# Patient Record
Sex: Male | Born: 1960 | Race: Black or African American | Hispanic: No | Marital: Single | Smoking: Current every day smoker
Health system: Southern US, Community
[De-identification: ages and names within clinical notes are randomized; demographics above are authoritative.]

## PROBLEM LIST (undated history)

## (undated) DIAGNOSIS — E119 Type 2 diabetes mellitus without complications: Secondary | ICD-10-CM

---

## 1998-11-25 ENCOUNTER — Emergency Department (HOSPITAL_COMMUNITY): Admission: EM | Admit: 1998-11-25 | Discharge: 1998-11-25 | Payer: Self-pay | Admitting: Emergency Medicine

## 2002-12-07 ENCOUNTER — Emergency Department (HOSPITAL_COMMUNITY): Admission: EM | Admit: 2002-12-07 | Discharge: 2002-12-07 | Payer: Self-pay | Admitting: *Deleted

## 2006-05-02 ENCOUNTER — Emergency Department (HOSPITAL_COMMUNITY): Admission: EM | Admit: 2006-05-02 | Discharge: 2006-05-02 | Payer: Self-pay | Admitting: Family Medicine

## 2006-11-28 ENCOUNTER — Ambulatory Visit: Payer: Self-pay | Admitting: Internal Medicine

## 2006-11-28 LAB — CONVERTED CEMR LAB
AST: 27 units/L (ref 0–37)
Albumin: 4 g/dL (ref 3.5–5.2)
Alkaline Phosphatase: 54 units/L (ref 39–117)
BUN: 9 mg/dL (ref 6–23)
Basophils Relative: 0.3 % (ref 0.0–1.0)
Bilirubin Urine: NEGATIVE
Bilirubin, Direct: 0.1 mg/dL (ref 0.0–0.3)
CO2: 27 meq/L (ref 19–32)
Chloride: 104 meq/L (ref 96–112)
Cholesterol: 212 mg/dL (ref 0–200)
Creatinine, Ser: 1.1 mg/dL (ref 0.4–1.5)
Eosinophils Relative: 0.9 % (ref 0.0–5.0)
HCT: 41.1 % (ref 39.0–52.0)
Hemoglobin: 14.7 g/dL (ref 13.0–17.0)
MCHC: 35.8 g/dL (ref 30.0–36.0)
PSA: 0.34 ng/mL
PSA: 0.34 ng/mL (ref 0.10–4.00)
Sodium: 139 meq/L (ref 135–145)
TSH: 1.33 microintl units/mL (ref 0.35–5.50)
Total Bilirubin: 0.8 mg/dL (ref 0.3–1.2)
Total CHOL/HDL Ratio: 4.6
Total Protein, Urine: NEGATIVE mg/dL
Total Protein: 7 g/dL (ref 6.0–8.3)
Triglycerides: 86 mg/dL (ref 0–149)
Urobilinogen, UA: 0.2 (ref 0.0–1.0)
VLDL: 17 mg/dL (ref 0–40)
pH: 6 (ref 5.0–8.0)

## 2006-12-01 ENCOUNTER — Ambulatory Visit: Payer: Self-pay | Admitting: Internal Medicine

## 2007-01-12 ENCOUNTER — Ambulatory Visit: Payer: Self-pay | Admitting: Internal Medicine

## 2007-01-12 LAB — CONVERTED CEMR LAB
Bilirubin Urine: NEGATIVE
Creatinine,U: 126.7 mg/dL
Crystals: NEGATIVE
Hemoglobin, Urine: NEGATIVE
Microalb, Ur: 0.3 mg/dL (ref 0.0–1.9)
Microalbumin U total vol: 0.3 mg/L
Nitrite: NEGATIVE
Testosterone: 652.77 ng/dL (ref 350.00–890)
Urine Glucose: NEGATIVE mg/dL
Urobilinogen, UA: 0.2 (ref 0.0–1.0)
pH: 6 (ref 5.0–8.0)

## 2007-03-10 ENCOUNTER — Ambulatory Visit: Payer: Self-pay | Admitting: Internal Medicine

## 2007-03-10 LAB — CONVERTED CEMR LAB
ALT: 21 units/L (ref 0–53)
HDL: 50.5 mg/dL (ref >39.0)
Total CHOL/HDL Ratio: 4.3
VLDL: 19 mg/dL (ref 0–40)

## 2007-03-11 ENCOUNTER — Encounter: Payer: Self-pay | Admitting: Internal Medicine

## 2007-03-11 DIAGNOSIS — R51 Headache: Secondary | ICD-10-CM

## 2007-03-11 DIAGNOSIS — R519 Headache, unspecified: Secondary | ICD-10-CM | POA: Insufficient documentation

## 2007-03-11 DIAGNOSIS — Z8659 Personal history of other mental and behavioral disorders: Secondary | ICD-10-CM

## 2007-03-11 DIAGNOSIS — K219 Gastro-esophageal reflux disease without esophagitis: Secondary | ICD-10-CM

## 2007-03-11 DIAGNOSIS — N3289 Other specified disorders of bladder: Secondary | ICD-10-CM

## 2007-03-11 DIAGNOSIS — E119 Type 2 diabetes mellitus without complications: Secondary | ICD-10-CM

## 2007-03-13 ENCOUNTER — Encounter: Payer: Self-pay | Admitting: Internal Medicine

## 2007-03-13 ENCOUNTER — Ambulatory Visit: Payer: Self-pay | Admitting: Internal Medicine

## 2007-03-13 DIAGNOSIS — M549 Dorsalgia, unspecified: Secondary | ICD-10-CM | POA: Insufficient documentation

## 2007-03-13 DIAGNOSIS — E782 Mixed hyperlipidemia: Secondary | ICD-10-CM | POA: Insufficient documentation

## 2007-03-13 DIAGNOSIS — F528 Other sexual dysfunction not due to a substance or known physiological condition: Secondary | ICD-10-CM | POA: Insufficient documentation

## 2007-04-04 ENCOUNTER — Ambulatory Visit: Payer: Self-pay | Admitting: Internal Medicine

## 2007-04-04 DIAGNOSIS — R300 Dysuria: Secondary | ICD-10-CM

## 2007-06-14 ENCOUNTER — Ambulatory Visit: Payer: Self-pay | Admitting: Internal Medicine

## 2007-06-14 DIAGNOSIS — I10 Essential (primary) hypertension: Secondary | ICD-10-CM | POA: Insufficient documentation

## 2007-06-14 LAB — CONVERTED CEMR LAB
Bilirubin, Direct: 0.1 mg/dL (ref 0.0–0.3)
CO2: 29 meq/L (ref 19–32)
Calcium: 9.8 mg/dL (ref 8.4–10.5)
Cholesterol: 225 mg/dL (ref 0–200)
GFR calc Af Amer: 103 mL/min
GFR calc non Af Amer: 85 mL/min
Glucose, Bld: 100 mg/dL — ABNORMAL HIGH (ref 70–99)
HDL: 42.4 mg/dL (ref >39.0)
Hgb A1c MFr Bld: 6.4 % — ABNORMAL HIGH (ref 4.6–6.0)
Potassium: 4.5 meq/L (ref 3.5–5.1)
Sodium: 139 meq/L (ref 135–145)
Total CHOL/HDL Ratio: 5.3
Total Protein: 6.9 g/dL (ref 6.0–8.3)
Triglycerides: 114 mg/dL (ref 0–149)

## 2007-06-16 ENCOUNTER — Ambulatory Visit: Payer: Self-pay | Admitting: Internal Medicine

## 2007-06-16 DIAGNOSIS — D179 Benign lipomatous neoplasm, unspecified: Secondary | ICD-10-CM | POA: Insufficient documentation

## 2008-01-10 ENCOUNTER — Ambulatory Visit: Payer: Self-pay | Admitting: Internal Medicine

## 2008-01-10 LAB — CONVERTED CEMR LAB
ALT: 19 units/L (ref 0–53)
AST: 29 units/L (ref 0–37)
BUN: 10 mg/dL (ref 6–23)
Chloride: 112 meq/L (ref 96–112)
Cholesterol: 162 mg/dL (ref 0–200)
GFR calc Af Amer: 103 mL/min
GFR calc non Af Amer: 85 mL/min
Glucose, Bld: 97 mg/dL (ref 70–99)
Ketones, ur: NEGATIVE mg/dL
Leukocytes, UA: NEGATIVE
Potassium: 4 meq/L (ref 3.5–5.1)
Sodium: 141 meq/L (ref 135–145)
Specific Gravity, Urine: 1.02 (ref 1.000–1.03)
Triglycerides: 98 mg/dL (ref 0–149)
Urine Glucose: NEGATIVE mg/dL
Urobilinogen, UA: 0.2 (ref 0.0–1.0)
pH: 6 (ref 5.0–8.0)

## 2008-01-12 ENCOUNTER — Encounter: Payer: Self-pay | Admitting: Internal Medicine

## 2008-01-15 ENCOUNTER — Ambulatory Visit: Payer: Self-pay | Admitting: Internal Medicine

## 2008-01-15 DIAGNOSIS — M7710 Lateral epicondylitis, unspecified elbow: Secondary | ICD-10-CM | POA: Insufficient documentation

## 2008-01-15 DIAGNOSIS — F17200 Nicotine dependence, unspecified, uncomplicated: Secondary | ICD-10-CM | POA: Insufficient documentation

## 2008-03-01 ENCOUNTER — Telehealth: Payer: Self-pay | Admitting: Internal Medicine

## 2008-03-07 ENCOUNTER — Telehealth: Payer: Self-pay | Admitting: Internal Medicine

## 2008-03-19 ENCOUNTER — Ambulatory Visit (HOSPITAL_COMMUNITY): Admission: RE | Admit: 2008-03-19 | Discharge: 2008-03-19 | Payer: Self-pay | Admitting: Orthopedic Surgery

## 2008-03-27 ENCOUNTER — Ambulatory Visit: Payer: Self-pay | Admitting: Internal Medicine

## 2008-04-24 ENCOUNTER — Ambulatory Visit: Payer: Self-pay | Admitting: Internal Medicine

## 2008-07-03 ENCOUNTER — Encounter (INDEPENDENT_AMBULATORY_CARE_PROVIDER_SITE_OTHER): Payer: Self-pay | Admitting: *Deleted

## 2008-10-11 IMAGING — CR DG LUMBAR SPINE 2-3V
2 series · 2 of 2 positions shown · non-contrast
Comparison: None.

CLINICAL DATA: [DATE]. Low back pain.

LUMBAR SPINE - 2 VIEW 03/13/2007:

[view not recorded (1 of 2)]
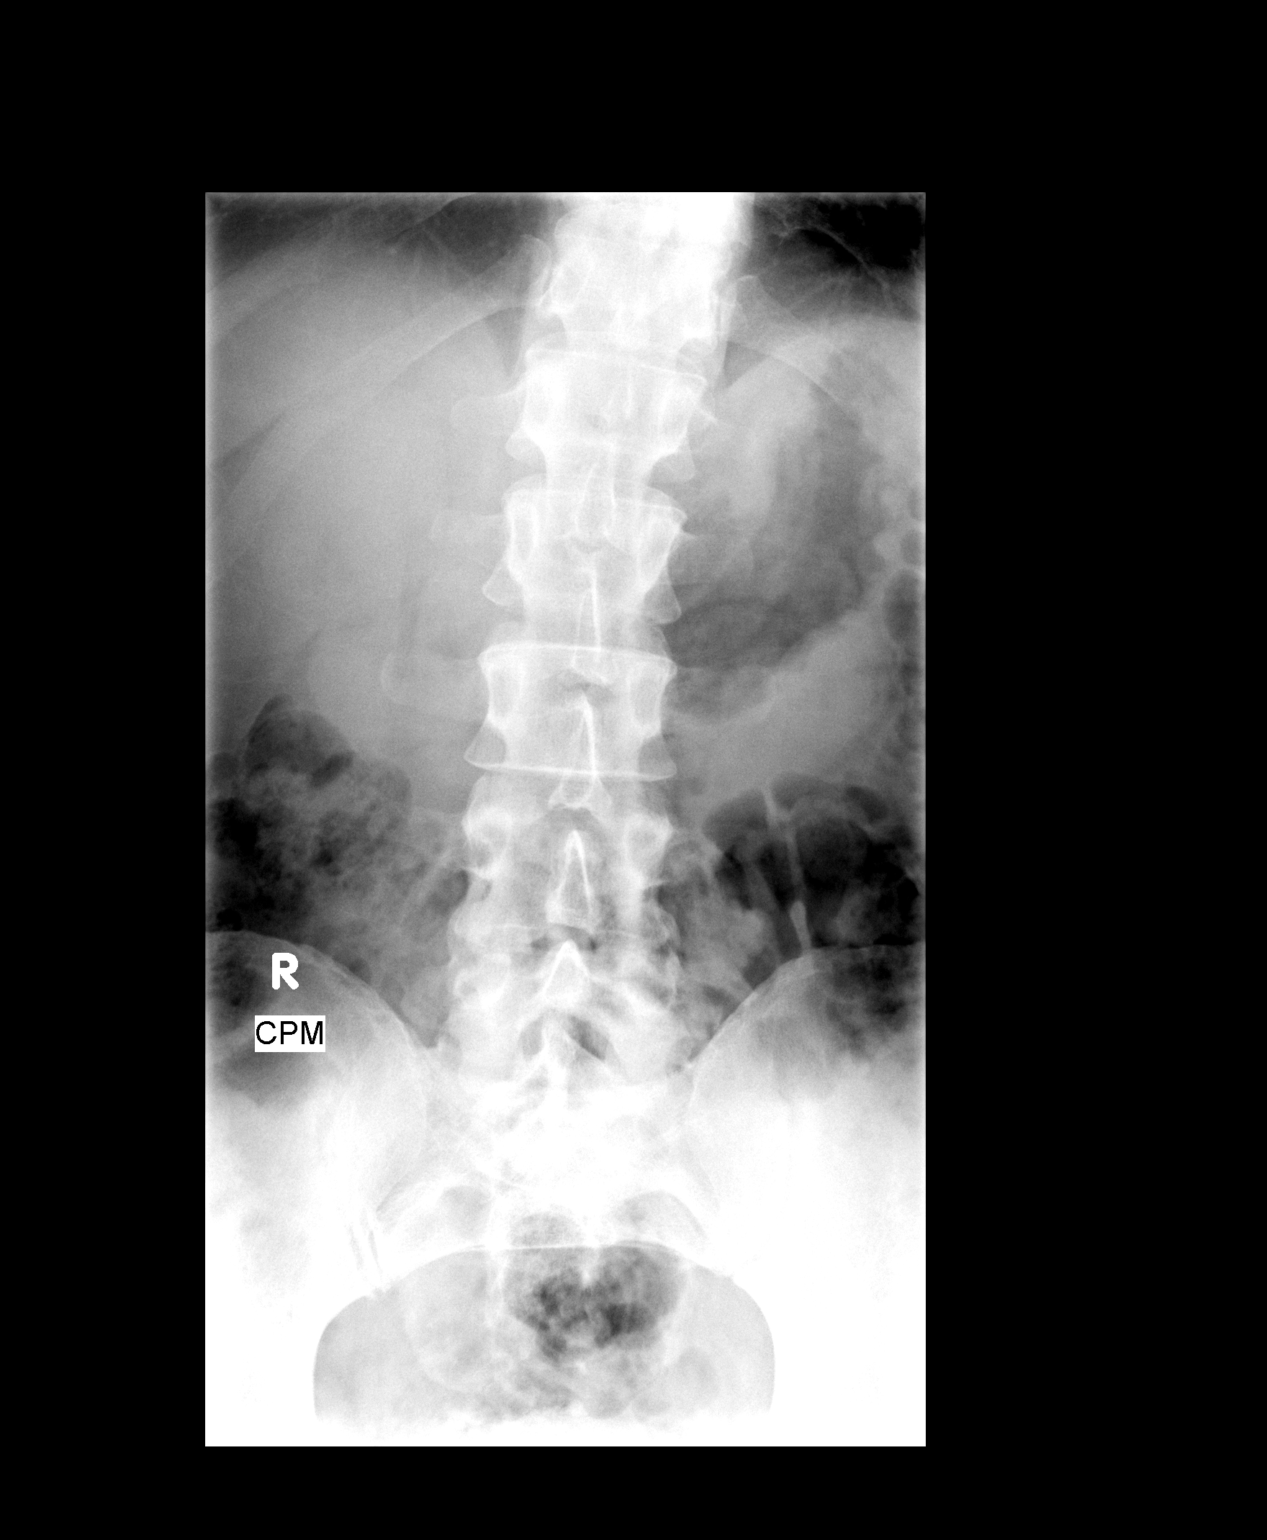

[view not recorded (2 of 2)]
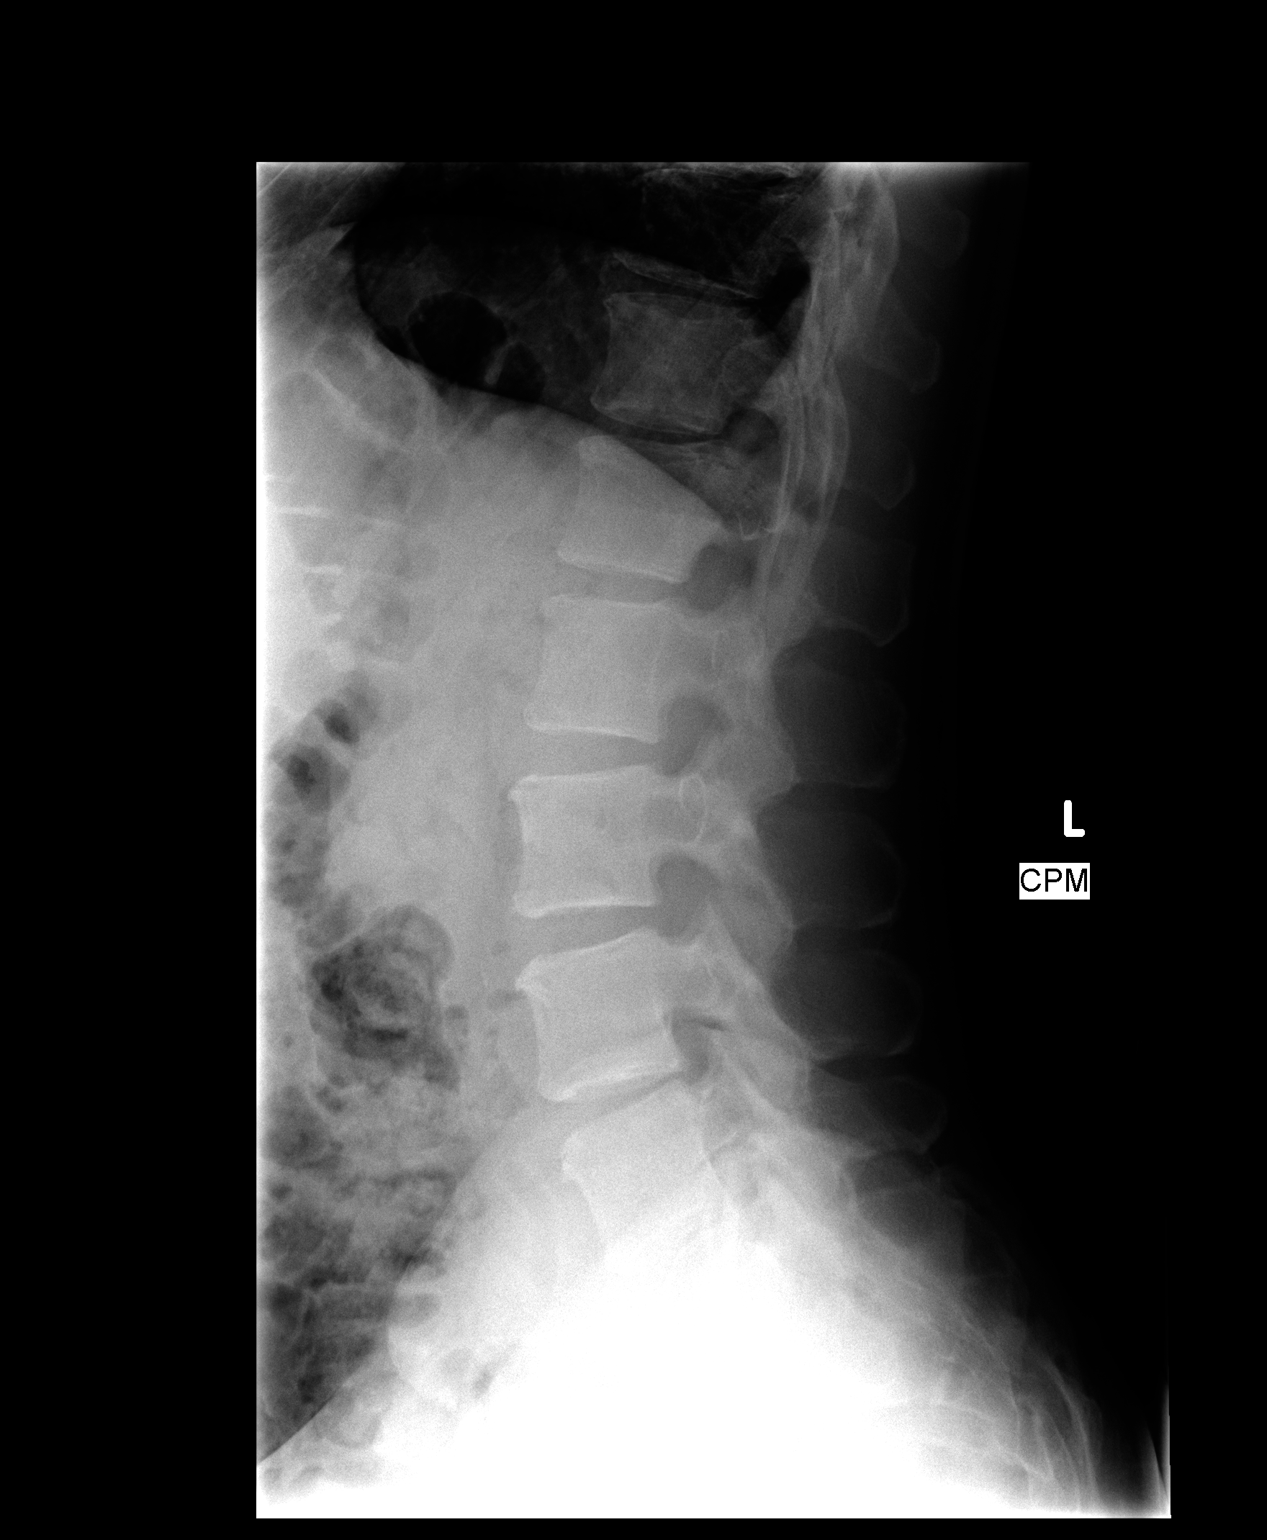

[2 of 2 positions shown; findings below may reference images not displayed]

FINDINGS: Five nonrib-bearing lumbar vertebra with anatomic alignment. No
fractures. Mild disc space narrowing at L4-L5. Mild endplate spurring adjacent
to the L3-L4 and L4-L5 discs. No significant posterior element hypertrophy.
Visualized sacroiliac joints intact.
IMPRESSION: No acute skeletal abnormalities. Mild degenerative disc disease at L4-L5. Mild
spondylosis involving L2, L3 and L4.

## 2009-01-14 ENCOUNTER — Ambulatory Visit: Payer: Self-pay | Admitting: Internal Medicine

## 2009-01-14 LAB — CONVERTED CEMR LAB
ALT: 14 units/L (ref 0–53)
AST: 19 units/L (ref 0–37)
Alkaline Phosphatase: 57 units/L (ref 39–117)
Bilirubin, Direct: <0.1 mg/dL (ref 0.0–0.3)
CO2: 24 meq/L (ref 19–32)
Calcium: 9.8 mg/dL (ref 8.4–10.5)
Cholesterol: 155 mg/dL (ref 0–200)
Creatinine, Ser: 1.06 mg/dL (ref 0.40–1.50)
Glucose, Bld: 111 mg/dL — ABNORMAL HIGH (ref 70–99)
Microalb, Ur: 0.69 mg/dL (ref 0.00–1.89)
Sodium: 138 meq/L (ref 135–145)
Total Bilirubin: 0.2 mg/dL — ABNORMAL LOW (ref 0.3–1.2)
Total CHOL/HDL Ratio: 2.7

## 2009-01-15 ENCOUNTER — Encounter: Payer: Self-pay | Admitting: Internal Medicine

## 2009-01-15 LAB — CONVERTED CEMR LAB
Chlamydia, Swab/Urine, PCR: NEGATIVE
GC Probe Amp, Urine: NEGATIVE

## 2009-01-16 ENCOUNTER — Encounter: Payer: Self-pay | Admitting: Internal Medicine

## 2009-01-16 ENCOUNTER — Telehealth: Payer: Self-pay | Admitting: Internal Medicine

## 2009-04-29 ENCOUNTER — Encounter: Admission: RE | Admit: 2009-04-29 | Discharge: 2009-04-29 | Payer: Self-pay | Admitting: Family Medicine

## 2009-05-29 ENCOUNTER — Telehealth: Payer: Self-pay | Admitting: Internal Medicine

## 2009-11-24 ENCOUNTER — Emergency Department (HOSPITAL_COMMUNITY): Admission: EM | Admit: 2009-11-24 | Discharge: 2009-11-24 | Payer: Self-pay | Admitting: Emergency Medicine

## 2009-12-17 ENCOUNTER — Ambulatory Visit: Payer: Self-pay | Admitting: Family

## 2009-12-17 ENCOUNTER — Ambulatory Visit: Payer: Self-pay | Admitting: Diagnostic Radiology

## 2009-12-17 ENCOUNTER — Ambulatory Visit (HOSPITAL_BASED_OUTPATIENT_CLINIC_OR_DEPARTMENT_OTHER): Admission: RE | Admit: 2009-12-17 | Discharge: 2009-12-17 | Payer: Self-pay | Admitting: Internal Medicine

## 2009-12-17 DIAGNOSIS — R209 Unspecified disturbances of skin sensation: Secondary | ICD-10-CM | POA: Insufficient documentation

## 2009-12-17 LAB — CONVERTED CEMR LAB
Folate: >20 ng/mL
Hgb A1c MFr Bld: 6.2 % — ABNORMAL HIGH (ref <5.7)
Vitamin B-12: 379 pg/mL (ref 211–911)

## 2009-12-18 ENCOUNTER — Encounter: Payer: Self-pay | Admitting: Internal Medicine

## 2009-12-18 ENCOUNTER — Telehealth: Payer: Self-pay | Admitting: Family

## 2009-12-18 LAB — CONVERTED CEMR LAB: Chlamydia, Swab/Urine, PCR: NEGATIVE

## 2009-12-19 ENCOUNTER — Telehealth: Payer: Self-pay | Admitting: Family

## 2009-12-22 ENCOUNTER — Telehealth: Payer: Self-pay | Admitting: Family

## 2010-01-05 ENCOUNTER — Ambulatory Visit: Payer: Self-pay | Admitting: Internal Medicine

## 2010-01-05 LAB — CONVERTED CEMR LAB
Bilirubin Urine: NEGATIVE
Hemoglobin, Urine: NEGATIVE
Ketones, ur: NEGATIVE mg/dL
Protein, ur: NEGATIVE mg/dL
Urine Glucose: NEGATIVE mg/dL
Urobilinogen, UA: 0.2 (ref 0.0–1.0)

## 2010-01-06 ENCOUNTER — Telehealth: Payer: Self-pay | Admitting: Internal Medicine

## 2010-01-08 ENCOUNTER — Encounter (INDEPENDENT_AMBULATORY_CARE_PROVIDER_SITE_OTHER): Payer: Self-pay | Admitting: *Deleted

## 2010-01-21 ENCOUNTER — Encounter: Payer: Self-pay | Admitting: Internal Medicine

## 2010-06-07 ENCOUNTER — Encounter: Payer: Self-pay | Admitting: Internal Medicine

## 2010-06-16 NOTE — Letter (Signed)
Summary: Primary Care Consult Scheduled Letter  [place] [facility]  [address]   [place], [place] [address]   Phone: [phone]  Fax: [phone]      01/08/2010 MRN: [id number]  [facility] [address] [place], [place]  [address]    Dear Curtis Herrera,      We have scheduled an appointment for you.  At the recommendation of Dr.YOO, we have scheduled you a consult with DR Antoine Poche UROLOGY  on SEPTEMBER  7,2011 at 3PM .  Their address is_[facility],  [address]  [place]  [id number]. The office phone number is [phone].  If this appointment day and time is not convenient for you, please feel free to call the office of the doctor you are being referred to at the number listed above and reschedule the appointment.     It is important for you to keep your scheduled appointments. We are here to make sure you are given good patient care.    Thank you,   Darral Dash Patient Care Coordinator [place] at [facility]

## 2010-06-16 NOTE — Assessment & Plan Note (Signed)
Summary: FOLLOW UP/DK   Vital Signs:  Patient profile:   50 year old male Weight:      159.50 pounds BMI:     33.46 O2 Sat:      99 % on Room air Temp:     98.0 degrees F oral Pulse rate:   93 / minute Pulse rhythm:   regular Resp:     18 per minute BP sitting:   120 / 80  (right arm) Cuff size:   regular  Vitals Entered By: Glendell Docker CMA (January 05, 2010 8:20 AM)  O2 Flow:  Room air CC: follow-up visit Is Patient Diabetic? Yes Pain Assessment Patient in pain? no      Comments does not feel  any better    Primary Care Provider:  Dondra Spry DO  CC:  follow-up visit.  History of Present Illness: 50 y/o AA male for f/u re:  genital symptoms itching around genitals - penis and base of penis  tip of penis is irritated with urination urethral symptoms better better after finishing abx but not resolved he was tx'ed with azithomycin, cipro and metronidiazole  intermittent suprapubic discomfort  wife went to GYN and was diagnosed with chlamydia  itching and burning better but not completely resolved  he denies ulcer or other skin lesion itching is not severe, not worse at night   Preventive Screening-Counseling & Management  Alcohol-Tobacco     Smoking Status: current  Allergies (verified): No Known Drug Allergies  Past History:  Past Medical History: Diabetes mellitus, type II GERD  Headache  Low back pain  Erectile dysfunction    Right epicondylitis   Family History: Family History Diabetes 1st degree relative Family History Other cancer     Social History: Single Current Smoker 1/2 ppd >25 yrs. Alcohol use-yes 3 Daughters  Occupation: [employer]     Physical Exam  General:  alert, well-developed, and well-nourished.   Lungs:  Normal respiratory effort, chest expands symmetrically. Lungs are clear to auscultation, no crackles or wheezes. Heart:  Normal rate and regular rhythm. S1 and S2 normal without gallop, murmur, click,  rub or other extra sounds. Abdomen:  soft, non-tender, normal bowel sounds, and no masses.   Genitalia:  uncircumcised, no cutaneous lesions, and no urethral discharge.     Impression & Recommendations:  Problem # 1:  DYSURIA (ICD-788.1) 50 y/o AA male with symptoms of unresolved urethritis despite taking azithromycin, cipro and flagyl. we discussed possibility of HSV.  empiric tx with valtrex if persistent symptoms, pt would like urology referral  The following medications were removed from the medication list:    Azithromycin 500 Mg Tabs (Azithromycin) .Marland Kitchen... 2 tabs by mouth x 1 dose    Metronidazole 500 Mg Tabs (Metronidazole) ..... One tablet by mouth three times a day x 7 days  Orders: T-Urinalysis ([id number]) T-Culture, Urine ([id number]) T- * Misc. Laboratory test ([phone])  Complete Medication List: 1)  Metformin Hcl 750 Mg Xr24h-tab (Metformin hcl) .... One by mouth two times a day 2)  Simvastatin 20 Mg Tabs (Simvastatin) .... One by mouth at bedtime 3)  Aspirin Ec Low Dose 81 Mg Tbec (Aspirin) .... One by mouth once daily 4)  Viagra 100 Mg Tabs (Sildenafil citrate) .... One half to one tab by mouth once daily as needed 5)  Freestyle Lite Test Strp (Glucose blood) .... Test daily  Patient Instructions: 1)  Please schedule a follow-up appointment in 2 weeks. 2)  The highlighted prescriptions were electronically sent  to your pharmacy Prescriptions: VALACYCLOVIR HCL 1 GM TABS (VALACYCLOVIR HCL) one by mouth two times a day  #14 x 0   Entered and Authorized by:   D. Thomos Lemons DO   Signed by:   D. Thomos Lemons DO on 01/05/2010   Method used:   Electronically to        [facility] [phone]* (retail)       [address]       [place], [place]  [address]       Ph: [phone]       Fax: [phone]   RxID:   [id number]   Current Allergies (reviewed today): No known allergies

## 2010-06-16 NOTE — Progress Notes (Signed)
Summary: Lab Results   Phone Note Outgoing Call   Summary of Call: Please call patient and let him know that his lab work was normal.  I would like to send him for an MRI of his neck to further evaluate the discs in his neck.  Initial call taken by: Lemont Fillers FNP,  December 18, 2009 9:17 PM  Follow-up for Phone Call        patient advised per [facility] instructions Follow-up by: Glendell Docker CMA,  December 19, 2009 8:03 AM

## 2010-06-16 NOTE — Progress Notes (Signed)
Summary: Do you want labs  Phone Note Call from Patient   Summary of Call: pt. is coming on 06/06/09 for his 4 mo. f/u. Do you need him to get any labs done prior to this? Call cell [phone] Initial call taken by: Michaelle Copas,  May 29, 2009 1:33 PM  Follow-up for Phone Call        bmet, a1c - 250.00 Follow-up by: D. Thomos Lemons DO,  May 29, 2009 5:26 PM  Additional Follow-up for Phone Call Additional follow up Details #1::        informed pt. of labs Additional Follow-up by: Michaelle Copas,  May 30, 2009 9:08 AM

## 2010-06-16 NOTE — Assessment & Plan Note (Signed)
Summary: diabetes/mhf--Rm 5   Vital Signs:  Patient profile:   50 year old male Height:      58 inches Weight:      161.25 pounds BMI:     33.82 Temp:     98.0 degrees F oral Pulse rate:   72 / minute Pulse rhythm:   regular Resp:     16 per minute BP sitting:   124 / 70  (right arm) Cuff size:   regular  Vitals Entered By: Mervin Kung CMA Duncan Dull) (December 17, 2009 10:51 AM) CC: Room 5   Follow up of diabetes. Also having bilateral numbness in hands and arms.  Having penile discomfort and itching; wants STD testing. Needs refills on Metformin, Viagra and Simvastatin. Is Patient Diabetic? Yes Comments Pt has completed Doxycycline. Nicki Guadalajara Fergerson CMA Duncan Dull)  December 17, 2009 10:59 AM    Primary Care Provider:  Dondra Spry DO  CC:  Room 5   Follow up of diabetes. Also having bilateral numbness in hands and arms.  Having penile discomfort and itching; wants STD testing. Needs refills on Metformin and Viagra and Simvastatin.Marland Kitchen  History of Present Illness: Curtis Herrera is a 50 year old male who presents today with several concerns:  1)Bilateral arm numbness- worse at night.  Numbness starts below the elbow to the hands and is bilateral in nature.  Comes and goes, but has become more frequent.  Denies neck pain.  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 This has been present x 3 weeks.    2) Notes that he also has complaint about penile disomfort "inside".  + external itching.  These symptoms started monday.  Denies fever.  Denies penile lesions or penile discharge.  Has had one sexual partner has one male partner x 15 years.    Allergies (verified): No Known Drug Allergies  Past History:  Past Medical History: Last updated: 01/14/2009 Diabetes mellitus, type II GERD  Headache Low back pain Erectile dysfunction    Right epicondylitis  Family History: Last updated: 04/04/2007 Family History Diabetes 1st degree  relative Family History Other cancer  Social History: Last updated: 04/24/2008 Single Current Smoker 1/2 ppd >25 yrs. Alcohol use-yes 3 Daughters Occupation: [employer]    Risk Factors: Smoking Status: current (01/14/2009) Packs/Day: 0.5 (01/14/2009)  Physical Exam  General:  Well-developed,well-nourished,in no acute distress; alert,appropriate and cooperative throughout examination Head:  Normocephalic and atraumatic without obvious abnormalities. No apparent alopecia or balding. Lungs:  Normal respiratory effort, chest expands symmetrically. Lungs are clear to auscultation, no crackles or wheezes. Heart:  Normal rate and regular rhythm. S1 and S2 normal without gallop, murmur, click, rub or other extra sounds. Abdomen:  Bowel sounds positive,abdomen soft and non-tender without masses, organomegaly or hernias noted. Genitalia:  circumcised and no urethral discharge.  No penile rashes or lesions noted. Neurologic:  Strong bilateral hand grasps.   5/5 bilateral UE strength. UE reflexes normal bilaterally. Negative phalans test, negative tinnel sign.   Psych:  Cognition and judgment appear intact. Alert and cooperative with normal attention span and concentration. No apparent delusions, illusions, hallucinations   Impression & Recommendations:  Problem # 1:  NUMBNESS, ARM (ICD-782.0) Assessment New  Bilateral arm/hand numbness.  C-spine film notes mild degenerative disc disease at C5-6 and to a lesser degree C4-5.  B12/folate WNL.  Will order MRI of the C-spine to further evaluate.      Orders:  TLB-B12 + Folate Pnl ([id number]) T-Cervicle Spine 2-3 Views (72040TC) Misc. Referral (Misc. Ref)  Problem # 2:  DYSURIA (ICD-788.1)  Will plan to send patient for urine culture, gc/Chlamydia DNA probe.  Will treat empirically for chlamydia with zithromax  Orders: Specimen Handling (14782) T-Chlamydia & GC Probe, Urine ([id number]) T-Culture, Urine  ([id number])  Problem # 3:  DIABETES MELLITUS, TYPE II (ICD-250.00) Assessment: Improved A1C wasw slightly improved from his last check (6.4 down to 6.2).  Stable, continue metformin.  Avoid ACE given his history of hyperkalemia.  Continue aspirin.  His updated medication list for this problem includes:    Metformin Hcl 750 Mg Xr24h-tab (Metformin hcl) ..... One by mouth two times a day    Aspirin Ec Low Dose 81 Mg Tbec (Aspirin) ..... One by mouth once daily  Orders: T-Hgb A1C ([id number])  Labs Reviewed: Creat: 1.06 (01/14/2009)   Microalbumin: 0.3 (01/12/2007) Reviewed HgBA1c results: 6.4 (01/14/2009)  6.3 (01/10/2008)  Complete Medication List: 1)  Metformin Hcl 750 Mg Xr24h-tab (Metformin hcl) .... One by mouth two times a day 2)  Simvastatin 20 Mg Tabs (Simvastatin) .... One by mouth at bedtime 3)  Aspirin Ec Low Dose 81 Mg Tbec (Aspirin) .... One by mouth once daily 4)  Viagra 100 Mg Tabs (Sildenafil citrate) .... One half to one tab by mouth once daily as needed 5)  Freestyle Lite Test Strp (Glucose blood) .... Test daily 6)  Azithromycin 500 Mg Tabs (Azithromycin) .... 2 tabs by mouth x 1 dose  Patient Instructions: 1)  Complete your lab work and your x-ray downstairs. 2)  Follow up with Dr. Artist Pais in 1 month. 3)  We will contact you with the results of you tests. Prescriptions: SIMVASTATIN 20 MG  TABS (SIMVASTATIN) one by mouth at bedtime  #30 x 1   Entered and Authorized by:   Lemont Fillers FNP   Signed by:   Lemont Fillers FNP on 12/17/2009   Method used:   Electronically to        [facility] [phone]* (retail)       [address]       [place], [place]  [address]       Ph: [phone]       Fax: [phone]   RxID:   [id number] VIAGRA 100 MG TABS (SILDENAFIL CITRATE) one half to one tab by mouth once daily as needed  #10 Each x 2   Entered and Authorized by:   Lemont Fillers FNP   Signed by:   Lemont Fillers FNP on  12/17/2009   Method used:   Electronically to        [facility] [phone]* (retail)       [address]       [place], [place]  [address]       Ph: [phone]       Fax: [phone]   RxID:   [id number] METFORMIN HCL 750 MG XR24H-TAB (METFORMIN HCL) one by mouth two times a day  #60 Each x 1   Entered and Authorized by:   Lemont Fillers FNP   Signed by:   Lemont Fillers FNP on 12/17/2009   Method used:   Electronically to        [facility] [phone]* (retail)       [address]       [place], [place]  [address]       Ph: [phone]  Fax: [phone]   RxID:   [id number] AZITHROMYCIN 500 MG TABS (AZITHROMYCIN) 2 tabs by mouth x 1 dose  #2 x 0   Entered and Authorized by:   Lemont Fillers FNP   Signed by:   Lemont Fillers FNP on 12/17/2009   Method used:   Electronically to        [facility] [phone]* (retail)       [address]       [place], [place]  [address]       Ph: [phone]       Fax: [phone]   RxID:   [id number]   Current Allergies (reviewed today): No known allergies

## 2010-06-16 NOTE — Progress Notes (Signed)
Summary: Urology Referral  Phone Note Outgoing Call   Summary of Call: call pt - genitial herpes titer is negative.  If pt still having genital symptoms within 1 week, we can help arrange urology evaluation Initial call taken by: D. Thomos Lemons DO,  January 06, 2010 5:55 PM  Follow-up for Phone Call        call placed to patient at [phone], he was advised per Dr Artist Pais instructions. Patient states that he still has unresolved symptoms and he would like to go ahead and have the urology referral arranged Glendell Docker CMA,  January 07, 2010 1:06 PM  patient called back and left voice message wanting to know if he should take the rx for Valtrex. He states he did not pick up the rx because he wanted to find out what the results of the test was.  Glendell Docker CMA  January 07, 2010 1:58 PM   Additional Follow-up for Phone Call Additional follow up Details #1::        no,  hold off on valtrex. see order for urology referral Additional Follow-up by: D. Thomos Lemons DO,  January 08, 2010 8:31 AM    Additional Follow-up for Phone Call Additional follow up Details #2::    call returned to patient at [phone],  no answer. A detailed voice message has been left informing patient per Dr Artist Pais instructions. He has been advised to call if any questions. Follow-up by: Glendell Docker CMA,  January 08, 2010 9:49 AM

## 2010-06-16 NOTE — Progress Notes (Signed)
  Phone Note Outgoing Call   Call placed by: Lemont Fillers FNP,  December 22, 2009 9:09 AM Call placed to: Patient Summary of Call: Left message for patient to return our call.  Will see how he is feeling.  Please let him know that his urine culture is negative.  If he is still having symptoms- will plan to treat with metronidazole empircally for trichomonas.   Initial call taken by: Lemont Fillers FNP,  December 22, 2009 9:10 AM  Follow-up for Phone Call        Called patient and let him know that urine culture was negative.  Pt still having dysuria.  Plan empiric treatment with metronidazole, and if no improvement will plan referral to urology.  Pt instructed to call us if symptoms worsen or do not improve. Follow-up by: Lemont Fillers FNP,  December 22, 2009 9:33 AM    New/Updated Medications: METRONIDAZOLE 500 MG TABS (METRONIDAZOLE) one tablet by mouth three times a day x 7 days Prescriptions: METRONIDAZOLE 500 MG TABS (METRONIDAZOLE) one tablet by mouth three times a day x 7 days  #21 x 0   Entered and Authorized by:   Lemont Fillers FNP   Signed by:   Lemont Fillers FNP on 12/22/2009   Method used:   Electronically to        [facility] [phone]* (retail)       [address]       [place], [place]  [address]       Ph: [phone]       Fax: [phone]   RxID:   [id number]

## 2010-06-16 NOTE — Consult Note (Signed)
Summary: [facility]   Imported By: Sherian Rein 01/31/2010 11:43:59  _____________________________________________________________________  External Attachment:    Type:   Image     Comment:   External Document

## 2010-06-16 NOTE — Progress Notes (Signed)
Summary: Concerns  Phone Note Call from Patient Call back at [phone]--wk,  [phone]  cell   Caller: Patient Call For: Curtis Craze, NP Reason for Call: Privacy/Consent Authorization Summary of Call: Pt states he is still having itching and penile discomfort. Wants to know what can be done to help these symptoms?  Pt concerned that he may have some other STD that we have not tested for. I advised pt that we are still waiting on urine culture results but pt was very anxious and insistent that we do further STD testing.  Please advise.  Nicki Guadalajara Fergerson CMA Duncan Dull)  December 19, 2009 3:38 PM   Follow-up for Phone Call        Called patient back, advised him that I have sent rx to his pharmacy for empiric treatment of UTI.  He reports that he has some external discomfort as well.  Recommended miconazole cream two times a day to penis in case he has an early balanitis.  He is concerned about possibility of trichomonas- told patient that if his urine culture is negative and symptoms persist, that we can consider a trichomonas PCR (urine) on monday. He will call the office on monday and let us know how he is feeling. Follow-up by: Lemont Fillers FNP,  December 19, 2009 4:12 PM    New/Updated Medications: CIPRO 500 MG TABS (CIPROFLOXACIN HCL) one tablet by mouth two times a day x 3 days MICONAZOLE NITRATE 2 % CREA (MICONAZOLE NITRATE) apply twice daily to affected area Prescriptions: CIPRO 500 MG TABS (CIPROFLOXACIN HCL) one tablet by mouth two times a day x 3 days  #6 x 0   Entered and Authorized by:   Lemont Fillers FNP   Signed by:   Lemont Fillers FNP on 12/19/2009   Method used:   Electronically to        [facility] [phone]* (retail)       [address]       [place], [place]  [address]       Ph: [phone]       Fax: [phone]   RxID:   [id number]

## 2010-08-02 LAB — RPR: RPR Ser Ql: NONREACTIVE

## 2010-08-02 LAB — HIV ANTIBODY (ROUTINE TESTING W REFLEX): HIV: NONREACTIVE

## 2010-09-29 NOTE — Assessment & Plan Note (Signed)
[facility]                           PRIMARY CARE OFFICE NOTE   NAME:Sotero, Curtis Herrera                      MRN:          [id number]  DATE:12/01/2006                            DOB:          23-Oct-1960    CHIEF COMPLAINT:  New patient to practice/left shoulder pain.   HISTORY OF PRESENT ILLNESS:  The patient is a 50 year old African-  American male who complains of left shoulder pain for the last 3 months.  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  He reports pain with abduction of his left upper extremity  and pain with laying on his left side.  He has taken over the counter  analgesics with some relief.  He has not seen an orthopedic physician in  the past.   His other concern today is urethritis.  He notes evaluation at [facility] in December 2007;  they performed testing for gonorrhea  and chlamydia.  This was reported negative.  He was treated with  antibiotics at that time.  He still continues to intermittently  experience burning sensation with urination, also reports discomfort  with ejaculation.   He denies any major illnesses, hospitalizations, including heart disease  or diabetes.   He previously lived in [place], [place], and suffered from  severe headaches, which apparently were associated with sinusitis.   PAST MEDICAL HISTORY SUMMARY:  1. History of headaches.  2. History of carpal tunnel syndrome, left wrist.  3. History of tobacco abuse.   CURRENT MEDICATIONS:  1. Multivitamin once a day.  2. Advil p.r.n.   ALLERGIES TO MEDICATIONS:  None known.   SOCIAL HISTORY:  Patient is single, he is currently living with his  common law wife.  He has 3 daughters from previous relation.   HABITS:  He is a social drinker, averages approximately 6 beers per  week. He started smoking at age 72 and currently smokes approximately  1/2 pack per day.   FAMILY HISTORY:  Mother  is 32, apparently is healthy.  Father age 32,  has some sort of cancer and is diabetic.  Patient has multiple siblings  with type 2 diabetes and also daughter is known to be diabetic and has  fibromyalgia.   REVIEW OF SYSTEMS:  No HEENT symptoms.  He denies any chest heaviness  with exertion or shortness of breath.  Has occasional cough. Denies  heartburn.  All other systems as noted above.   PHYSICAL EXAMINATION:  VITALS:  Height is 5 foot 8 inches, weight 171  pounds, temperature is 97.8, pulse is 80, blood pressure is 120/73.  GENERAL:  The patient is a pleasant, well-developed, well-nourished 13-  year-old African-American male in no apparent distress.  HEENT:  Normocephalic.  Atraumatic.  Pupils are equal and reactive to  light bilaterally.  Extraocular motility was intact.  Patient was  anicteric.  Conjunctivae within normal limits.  External auditory canals  and tympanic membranes are clear bilaterally. Oropharyngeal exam was  unremarkable.  NECK:  Supple without any evidence of adenopathy or carotid bruit or  thyromegaly.  CHEST:  Normal inspiratory effort.  Chest was clear to auscultation  bilaterally. No rhonchi, rales or wheezing.  CARDIOVASCULAR:  Regular rate and rhythm.  No significant murmurs, rubs  or gallops appreciated.  ABDOMEN:  Soft, nontender.  Positive bowel sounds.  No organomegaly.  MUSCULOSKELETAL:  Patient was able to abduct his left shoulder with some  pain.  There was point tenderness at AP joint.  NEUROLOGIC:  Cranial nerves II through XII were grossly intact.  He was  nonfocal.  His mood and affect were appropriate.   LABORATORY DATA:  The patient had screening labs with following results:  CBC showed WBC of 11.8, H&H at 14.7 and 41.1.  Platelet count of 241.  Basic metabolic profile notable for a normal creatinine at 1.1, BUN 9,  fasting blood sugar was elevated to 139.  Cholesterol panel:  Total  cholesterol 212, triglycerides 86, HDL 45.7 and LDL of  148.3.  TSH was  normal at 1.33.  A screening PSA was normal at 0.34 and his UA was  unremarkable.   IMPRESSION/RECOMMENDATIONS:  1. Left shoulder pain secondary to probable rotator cuff tendinitis      versus bursitis.  2. Nongonococcal urethritis, question prostatitis.  3. Abnormal glucose.  4. Ongoing tobacco abuse.  5. Health maintenance.   RECOMMENDATIONS:  1. The patient will be referred to [facility] for further      work up and evaluation of left shoulder pain.  I will defer MRI or      injection therapy to the orthopedic physician.  2. The patient will be started on doxycycline 100 mg p.o. b.i.d.  His      UA seems to be unremarkable.  He may need 3 to 4 weeks of Cipro for      possible prostatitis.  We discussed possible referral to urologist.  3. I recommended obtain hemoglobin A1C and we will arrange followup in      2 to 3 weeks.  Patient likely is diabetic considering family      history.    Barbette Hair. Artist Pais, DO  Electronically Signed   RDY/MedQ  DD: 12/02/2006  DT: 12/02/2006  Job #: [id number]

## 2016-12-10 ENCOUNTER — Encounter (HOSPITAL_COMMUNITY): Payer: Self-pay | Admitting: Emergency Medicine

## 2016-12-10 ENCOUNTER — Emergency Department (HOSPITAL_COMMUNITY): Payer: Self-pay

## 2016-12-10 ENCOUNTER — Emergency Department (HOSPITAL_COMMUNITY)
Admission: EM | Admit: 2016-12-10 | Discharge: 2016-12-10 | Disposition: A | Discharge status: Home / Self Care | Payer: Self-pay | Attending: Emergency Medicine | Admitting: Emergency Medicine

## 2016-12-10 DIAGNOSIS — W109XXA Fall (on) (from) unspecified stairs and steps, initial encounter: Secondary | ICD-10-CM | POA: Insufficient documentation

## 2016-12-10 DIAGNOSIS — Y929 Unspecified place or not applicable: Secondary | ICD-10-CM | POA: Insufficient documentation

## 2016-12-10 DIAGNOSIS — Y999 Unspecified external cause status: Secondary | ICD-10-CM | POA: Insufficient documentation

## 2016-12-10 DIAGNOSIS — E119 Type 2 diabetes mellitus without complications: Secondary | ICD-10-CM | POA: Insufficient documentation

## 2016-12-10 DIAGNOSIS — W19XXXA Unspecified fall, initial encounter: Secondary | ICD-10-CM

## 2016-12-10 DIAGNOSIS — F17200 Nicotine dependence, unspecified, uncomplicated: Secondary | ICD-10-CM | POA: Insufficient documentation

## 2016-12-10 DIAGNOSIS — Y9301 Activity, walking, marching and hiking: Secondary | ICD-10-CM | POA: Insufficient documentation

## 2016-12-10 DIAGNOSIS — Z7982 Long term (current) use of aspirin: Secondary | ICD-10-CM | POA: Insufficient documentation

## 2016-12-10 DIAGNOSIS — M25552 Pain in left hip: Secondary | ICD-10-CM | POA: Insufficient documentation

## 2016-12-10 DIAGNOSIS — I10 Essential (primary) hypertension: Secondary | ICD-10-CM | POA: Insufficient documentation

## 2016-12-10 HISTORY — DX: Type 2 diabetes mellitus without complications: E11.9

## 2016-12-10 MED ORDER — OXYCODONE-ACETAMINOPHEN 5-325 MG PO TABS: 2.0000 | ORAL_TABLET | Freq: Four times a day (QID) | ORAL | 0 refills | Status: DC | PRN

## 2016-12-10 MED ORDER — HYDROMORPHONE HCL 1 MG/ML IJ SOLN
0.5000 mg | Freq: Once | INTRAMUSCULAR | Status: AC
  Administered 2016-12-10: 0.5 mg via INTRAMUSCULAR
  Filled 2016-12-10: qty 1

## 2016-12-10 MED ORDER — CYCLOBENZAPRINE HCL 10 MG PO TABS: 10.0000 mg | ORAL_TABLET | Freq: Two times a day (BID) | ORAL | 0 refills | Status: DC | PRN

## 2016-12-10 MED ORDER — LIDO-CAPSAICIN-MEN-METHYL SAL 4-0.0375-5-20 % EX PTCH: 1.0000 | MEDICATED_PATCH | CUTANEOUS | 0 refills | Status: DC

## 2016-12-10 MED FILL — CYCLOBENZAPRINE 10 MG TABLE: 10 | 10 days supply | Qty: 20 | Fill #0

## 2016-12-10 MED FILL — OXYCODONE-ACETAMINOPHEN 5-3: 5-325 | 3 days supply | Qty: 14 | Fill #0

## 2016-12-10 NOTE — ED Provider Notes (Signed)
[facility] DEPT Provider Note   CSN: [id number] Arrival date & time: 12/10/16  1352  By signing my name below, I, [name], attest that this documentation has been prepared under the direction and in the presence of [name], PA-C. Electronically Signed: [name], ED Scribe. 12/10/16. 3:52 PM.  History   Chief Complaint Chief Complaint  Patient presents with  . Fall  . Hip Pain   The history is provided by the patient. No language interpreter was used.   HPI Comments: Curtis Herrera is a 56 y.o. male who presents to the Emergency Department complaining of gradually worsening, severe, hip pain and left lower back pain s/p a mechanical fall that occurred yesterday evening at 8:00pm. The pt states that he was carrying some things, missed a step, and fell backward down 3 steps before landing on his lower left back and hip. The pt denies head injury and syncope. Ambulation and movement exacerbates his pain. The pt works in the movement of equipment. The pt has a PMHx of DM, lipoma, and HLD. He denies back surgeries. The pt denies activity change, SOB, chest pain, abdominal pain, nausea, vomiting, numbness and weakness.   Past Medical History:  Diagnosis Date  . Diabetes mellitus without complication [facility])     Patient Active Problem List   Diagnosis Date Noted  . NUMBNESS, ARM 12/17/2009  . TOBACCO ABUSE 01/15/2008  . [place], RIGHT 01/15/2008  . LIPOMA 06/16/2007  . HYPERTENSION 06/14/2007  . DYSURIA 04/04/2007  . HYPERLIPIDEMIA 03/13/2007  . ERECTILE DYSFUNCTION 03/13/2007  . BACK PAIN 03/13/2007  . DIABETES MELLITUS, TYPE II 03/11/2007  . GERD 03/11/2007  . Other specified disorders of bladder 03/11/2007  . Headache(784.0) 03/11/2007  . DEPRESSION, HX OF 03/11/2007    History reviewed. No pertinent surgical history.     Home Medications    Prior to Admission medications   Medication Sig Start Date End Date Taking? Authorizing Provider    aspirin EC 81 MG tablet Take 81 mg by mouth daily.   Yes [provider]  ibuprofen (ADVIL,MOTRIN) 200 MG tablet Take 400-600 mg by mouth every 6 (six) hours as needed for headache or moderate pain.   Yes [provider]  Multiple Vitamin (MULTIVITAMIN WITH MINERALS) TABS tablet Take 1 tablet by mouth daily.   Yes [provider]  cyclobenzaprine (FLEXERIL) 10 MG tablet Take 1 tablet (10 mg total) by mouth 2 (two) times daily as needed for muscle spasms. 12/10/16   [name], PA-C  Lido-Capsaicin-Men-Methyl Sal (1ST MEDX-PATCH/ LIDOCAINE) 4-0.373-10-03 % PTCH Apply 1 patch topically daily. 12/10/16   Ellenie Salome A, PA-C  oxyCODONE-acetaminophen (PERCOCET/ROXICET) 5-325 MG tablet Take 2 tablets by mouth every 6 (six) hours as needed for severe pain. 12/10/16   Davinci Glotfelty, Laymond Purser, PA-C    Family History History reviewed. No pertinent family history.  Social History Social History  Substance Use Topics  . Smoking status: Current Every Day Smoker  . Smokeless tobacco: Never Used  . Alcohol use Yes     Allergies   Patient has no known allergies.   Review of Systems Review of Systems  Constitutional: Negative for activity change.  Respiratory: Negative for shortness of breath.   Cardiovascular: Negative for chest pain.  Gastrointestinal: Negative for abdominal pain, nausea and vomiting.  Musculoskeletal: Positive for arthralgias and back pain.  Skin: Negative for rash.  Neurological: Negative for weakness and numbness.     Physical Exam Updated Vital Signs BP (!) 142/88 (BP Location:  Right Arm)   Pulse 88   Temp 98.1 F (36.7 C) (Oral)   Resp 17   SpO2 99%   Physical Exam  Constitutional: He appears well-developed.  HENT:  Head: Normocephalic.  Eyes: Conjunctivae are normal.  Neck: Neck supple.  Cardiovascular: Normal rate and regular rhythm.   No murmur heard. Pulmonary/Chest: Effort normal.  Abdominal: Soft. He exhibits no distension.   Musculoskeletal:  Tender to palpation over the left posterior pelvis, just superior to the buttock. Antalgic gait. Able to bare weight on both bilateral lower extremities. Sensation was intact and had  5/5 motor strength of the bilateral lower extremities. Exam of the lumbar spine was unremarkable.  Neurological: He is alert.  Skin: Skin is warm and dry.  Psychiatric: His behavior is normal.  Nursing note and vitals reviewed.   ED Treatments / Results   DIAGNOSTIC STUDIES: Oxygen Saturation is 100% on RA, normal by my interpretation.   COORDINATION OF CARE: 2:23 PM-Discussed next steps with pt. Pt verbalized understanding and is agreeable with the plan.   Labs (all labs ordered are listed, but only abnormal results are displayed) Labs Reviewed - No data to display  EKG  EKG Interpretation None       Radiology Dg Hip Unilat With Pelvis 2-3 Views Left  Result Date: 12/10/2016 CLINICAL DATA:  56 year old who fell backwards off of stairs yesterday landing on his back, with complaints of persistent posterior left hip pain. Initial encounter. EXAM: DG HIP (WITH OR WITHOUT PELVIS) 2-3V LEFT COMPARISON:  None. FINDINGS: No evidence of acute fracture or dislocation. Well-preserved joint space. Well-preserved bone mineral density. Included AP pelvis demonstrates no fractures elsewhere. Moderate joint space narrowing in the contralateral right hip with associated hypertrophic spurring involving the acetabulum and femoral head. Sacroiliac joints and symphysis pubis intact. Degenerative changes involving the visualized lower lumbar spine. IMPRESSION: 1. No acute osseous abnormality.  Normal appearing left hip. 2. Moderate osteoarthritis involving the right hip. 3. Degenerative changes involving the visualized lower lumbar spine. Electronically Signed   By: Evangeline Dakin M.D.   On: 12/10/2016 14:25    Procedures Procedures (including critical care time)  Medications Ordered in  ED Medications  HYDROmorphone (DILAUDID) injection 0.5 mg (0.5 mg Intramuscular Given 12/10/16 1519)     Initial Impression / Assessment and Plan / ED Course  I have reviewed the triage vital signs and the nursing notes.  Pertinent labs & imaging results that were available during my care of the patient were reviewed by me and considered in my medical decision making (see chart for details).     56 year old male presenting with acute left hip pain that began after a mechanical fall last night. X-ray negative for acute fractures. Pain controlled in the emergency department, and the patient is able to bear weight and take a couple steps. Antalgic gait. Will discharge the patient with crutches, pain control, and recommended additional conservative RICE therapy. The patient does not currently have a PCP, but resources were provided. Encouraged follow-up with a primary care provider in the next week if his pain does not improve to reevaluate for possible occult fracture. Discussed the plan with the patient and his wife, who are agreeable at this time. No acute distress. Vital signs stable. The patient is safe and stable for discharge.  Final Clinical Impressions(s) / ED Diagnoses   Final diagnoses:  Fall, initial encounter  Left hip pain    New Prescriptions Discharge Medication List as of 12/10/2016  4:21 PM  START taking these medications   Details  cyclobenzaprine (FLEXERIL) 10 MG tablet Take 1 tablet (10 mg total) by mouth 2 (two) times daily as needed for muscle spasms., Starting Fri 12/10/2016, Print    Lido-Capsaicin-Men-Methyl Sal (1ST MEDX-PATCH/ LIDOCAINE) 4-0.373-10-03 % PTCH Apply 1 patch topically daily., Starting Fri 12/10/2016, Print    oxyCODONE-acetaminophen (PERCOCET/ROXICET) 5-325 MG tablet Take 2 tablets by mouth every 6 (six) hours as needed for severe pain., Starting Fri 12/10/2016, Print       I personally performed the services described in this documentation, which  was scribed in my presence. The recorded information has been reviewed and is accurate.     Joanne Gavel, PA-C 12/10/16 2055    Fatima Blank, MD 12/11/16 [phone]

## 2016-12-10 NOTE — ED Triage Notes (Signed)
Pt reports he fell down his steps last night and landed on his L hip and lower back. Pt reports he is able to bear some weight on his L hip, but it hurts. Has been able to ambulate. Did not hit head when he fell.

## 2016-12-10 NOTE — Discharge Instructions (Signed)
You may take 1-2 tablets of Percocet every 6 hours as needed for severe pain. You may apply 1 lidocaine patch to sore muscles once every 24 hours. If you develop muscle spasms, you can take Flexeril up to 2 times per day. Please use caution with a Percocet. It is a narcotic and can be addicting. Both Percocet and Flexeril can cause you to be more sleepy so please do not take before you have to drive or go to work. You can call the number on her discharge paperwork to get established with a primary care provider. If your symptoms do not improve within 7 days, please follow-up with primary care. If he develop new or worsening symptoms, including have another fall or injury, numbness or weakness down in the leg or the toes, please return to the emergency department for reevaluation.

## 2018-07-11 IMAGING — CR DG HIP (WITH OR WITHOUT PELVIS) 2-3V*L*
3 series · 3 of 3 positions shown · non-contrast
Comparison: None.

CLINICAL DATA: 55-year-old who fell backwards off of stairs
yesterday landing on his back, with complaints of persistent
posterior left hip pain. Initial encounter.

EXAM:
DG HIP (WITH OR WITHOUT PELVIS) 2-3V LEFT

[t pelvis ap]
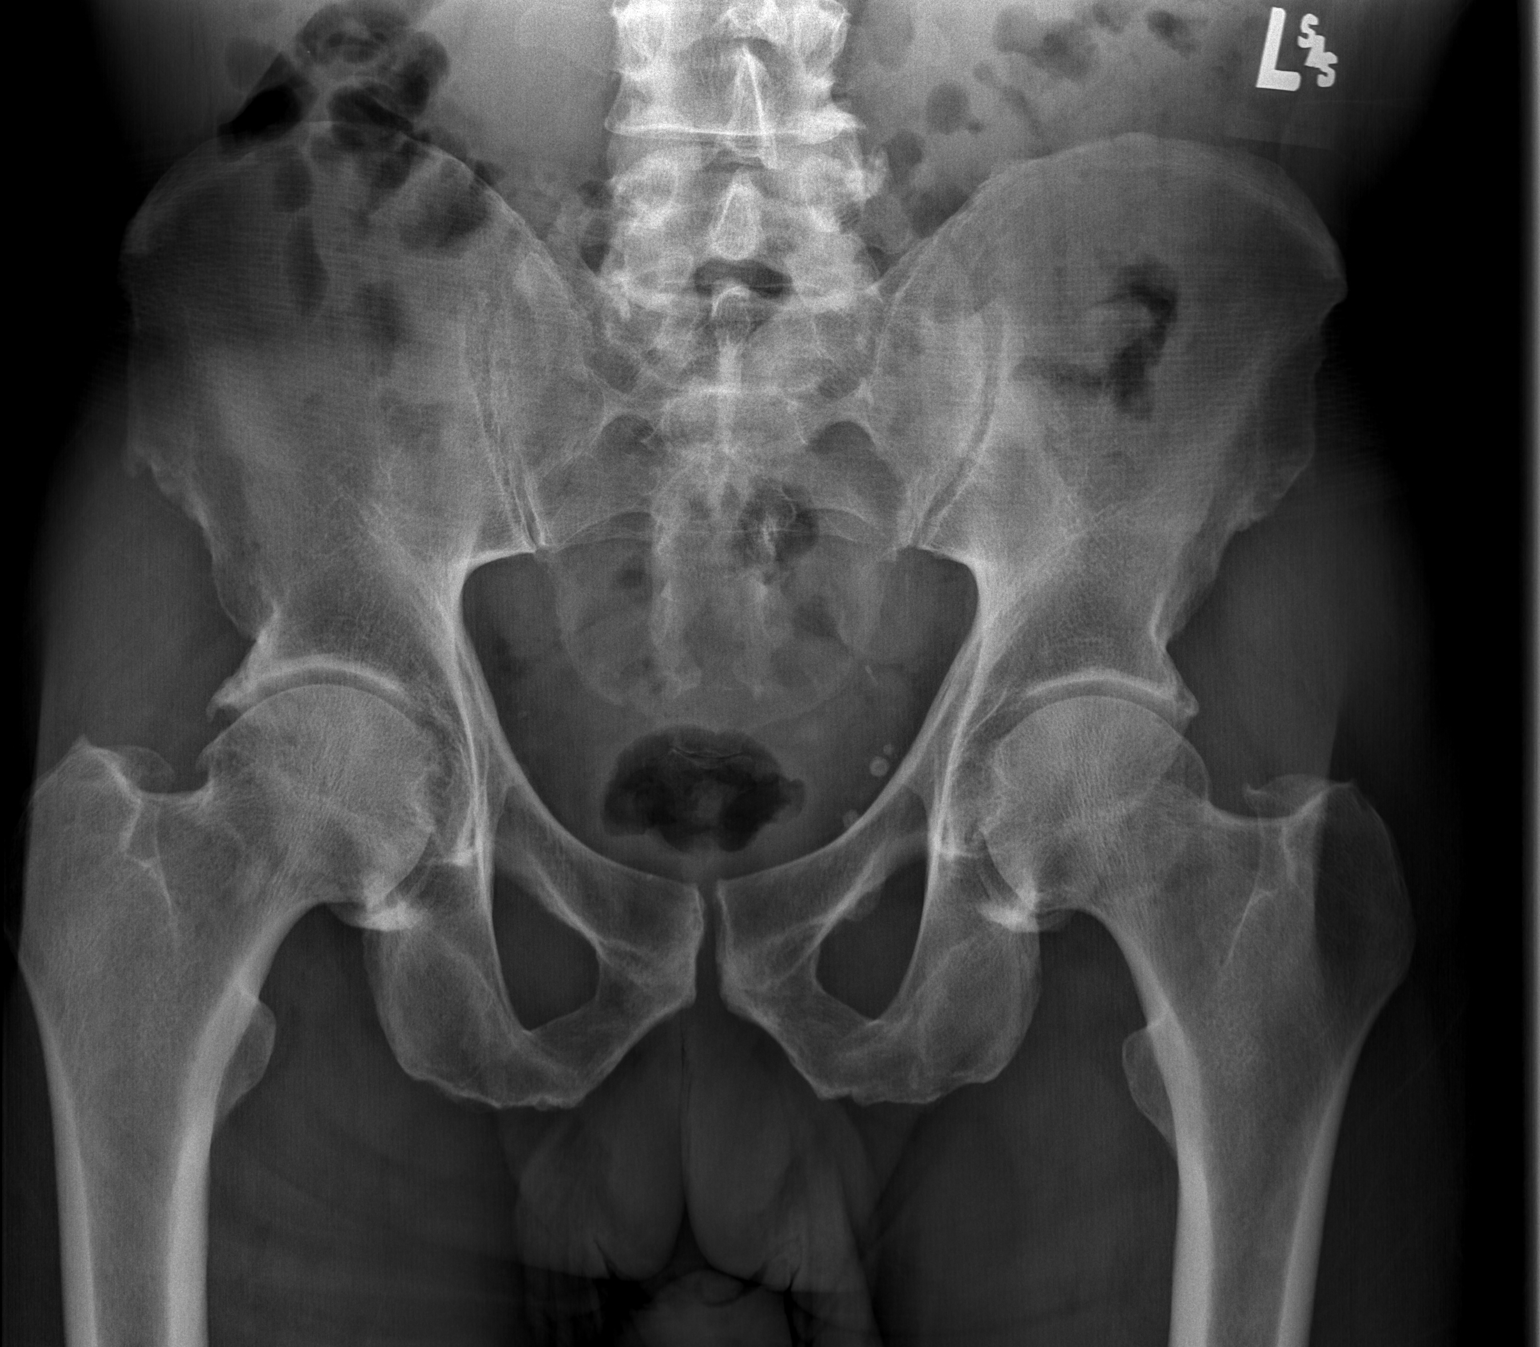

[t hip ap left]
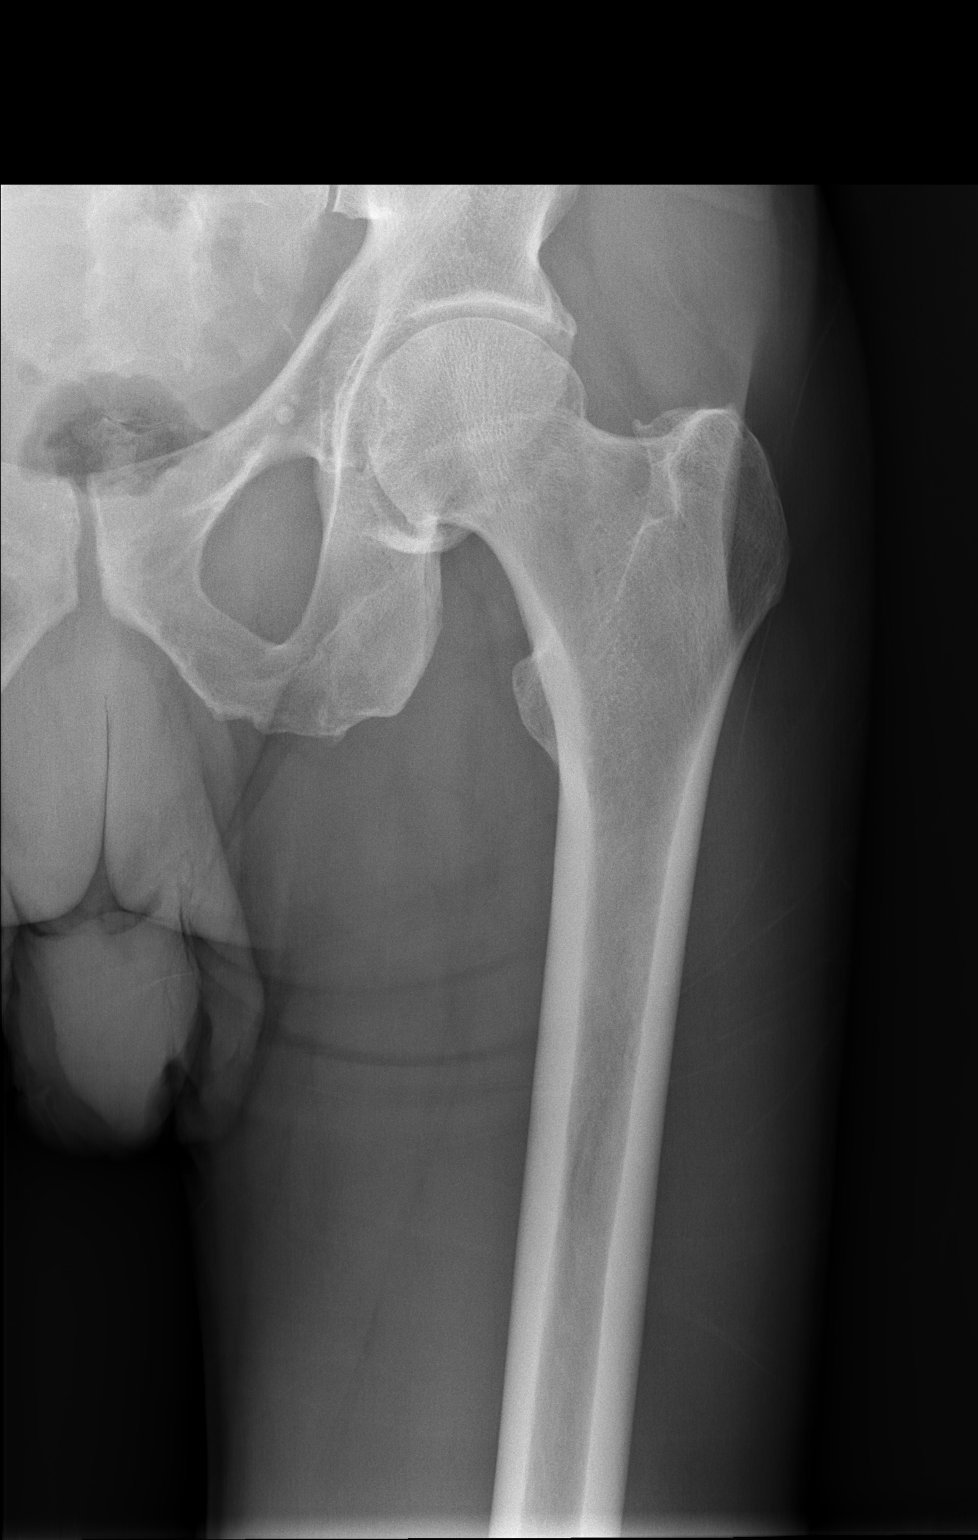

[t hip frog leg left]
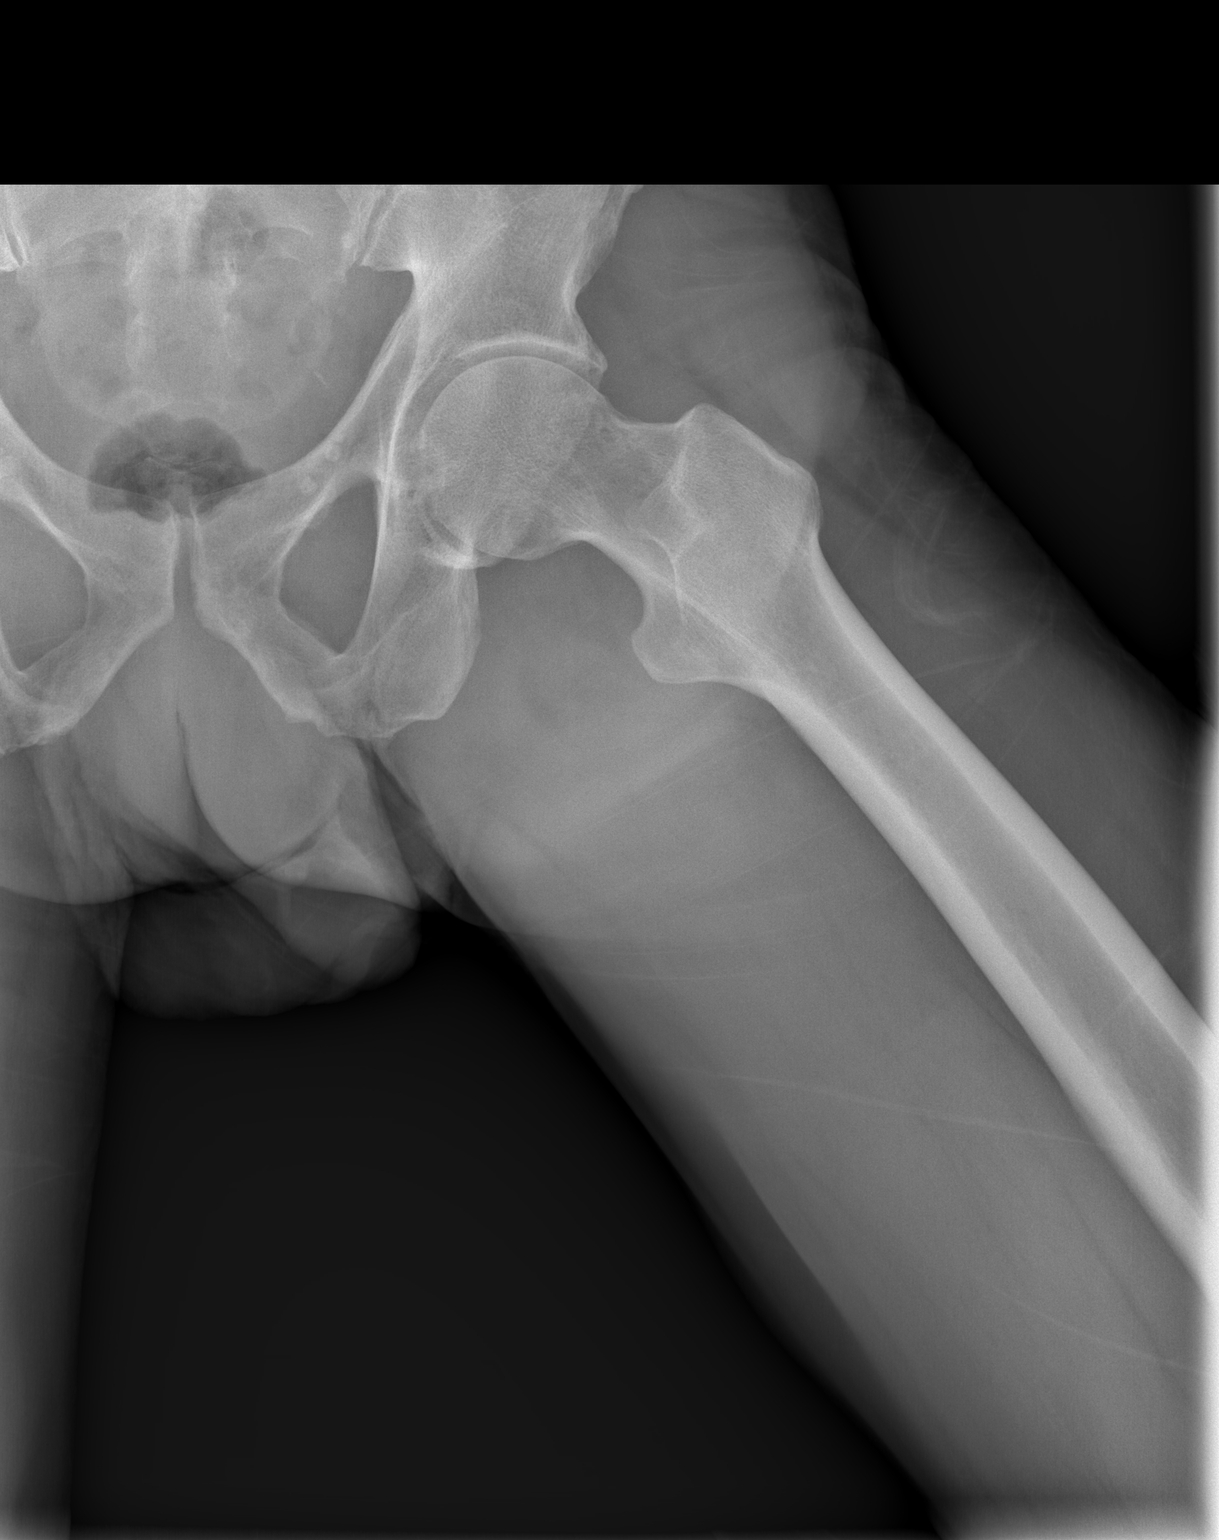

[3 of 3 positions shown; findings below may reference images not displayed]

FINDINGS: No evidence of acute fracture or dislocation. Well-preserved joint
space. Well-preserved bone mineral density.

Included AP pelvis demonstrates no fractures elsewhere. Moderate
joint space narrowing in the contralateral right hip with associated
hypertrophic spurring involving the acetabulum and femoral head.
Sacroiliac joints and symphysis pubis intact. Degenerative changes
involving the visualized lower lumbar spine.
IMPRESSION: 1. No acute osseous abnormality.  Normal appearing left hip.
2. Moderate osteoarthritis involving the right hip.
3. Degenerative changes involving the visualized lower lumbar spine.

## 2019-08-18 ENCOUNTER — Ambulatory Visit: Payer: Self-pay

## 2021-03-05 ENCOUNTER — Encounter: Payer: Self-pay | Admitting: Emergency Medicine

## 2021-03-05 ENCOUNTER — Ambulatory Visit
Admission: EM | Admit: 2021-03-05 | Discharge: 2021-03-05 | Disposition: A | Discharge status: Home / Self Care | Payer: 59

## 2021-03-05 ENCOUNTER — Other Ambulatory Visit: Payer: Self-pay

## 2021-03-05 DIAGNOSIS — J069 Acute upper respiratory infection, unspecified: Secondary | ICD-10-CM | POA: Diagnosis not present

## 2021-03-05 MED ORDER — ALBUTEROL SULFATE HFA 108 (90 BASE) MCG/ACT IN AERS: 1.0000 | INHALATION_SPRAY | Freq: Four times a day (QID) | RESPIRATORY_TRACT | 0 refills | Status: AC | PRN

## 2021-03-05 NOTE — ED Provider Notes (Signed)
[facility]    CSN: [id number] Arrival date & time: 03/05/21  1439      History   Chief Complaint Chief Complaint  Patient presents with   Cough    HPI Curtis Herrera is a 60 y.o. male.   Patient here today for evaluation of cough and chest congestion that started 4 days ago.  He reports that he has not had significant sore throat or ear pain.  He has tried TheraFlu and NyQuil without resolution.  He denies any nausea, vomiting or diarrhea.  The history is provided by the patient.  Cough Associated symptoms: chills   Associated symptoms: no ear pain, no eye discharge, no fever, no shortness of breath and no sore throat    Past Medical History:  Diagnosis Date   Diabetes mellitus without complication [facility])     Patient Active Problem List   Diagnosis Date Noted   NUMBNESS, ARM 12/17/2009   TOBACCO ABUSE 01/15/2008   EPICONDYLITIS, RIGHT 01/15/2008   LIPOMA 06/16/2007   HYPERTENSION 06/14/2007   DYSURIA 04/04/2007   HYPERLIPIDEMIA 03/13/2007   ERECTILE DYSFUNCTION 03/13/2007   BACK PAIN 03/13/2007   DIABETES MELLITUS, TYPE II 03/11/2007   GERD 03/11/2007   Other specified disorders of bladder 03/11/2007   Headache(784.0) 03/11/2007   DEPRESSION, HX OF 03/11/2007    History reviewed. No pertinent surgical history.     Home Medications    Prior to Admission medications   Medication Sig Start Date End Date Taking? Authorizing Provider  albuterol (VENTOLIN HFA) 108 (90 Base) MCG/ACT inhaler Inhale 1-2 puffs into the lungs every 6 (six) hours as needed for wheezing or shortness of breath. 03/05/21  Yes Francene Finders, PA-C  Ascorbic Acid (VITAMIN C PO) Take by mouth.   Yes [provider]  Multiple Vitamin (MULTIVITAMIN WITH MINERALS) TABS tablet Take 1 tablet by mouth daily.   Yes [provider]  aspirin EC 81 MG tablet Take 81 mg by mouth daily.    [provider]  cyclobenzaprine (FLEXERIL) 10 MG tablet Take 1  tablet (10 mg total) by mouth 2 (two) times daily as needed for muscle spasms. 12/10/16   McDonald, Mia A, PA-C  ibuprofen (ADVIL,MOTRIN) 200 MG tablet Take 400-600 mg by mouth every 6 (six) hours as needed for headache or moderate pain.    [provider]  Lido-Capsaicin-Men-Methyl Sal (1ST MEDX-PATCH/ LIDOCAINE) 4-0.373-10-03 % PTCH Apply 1 patch topically daily. 12/10/16   McDonald, Mia A, PA-C  oxyCODONE-acetaminophen (PERCOCET/ROXICET) 5-325 MG tablet Take 2 tablets by mouth every 6 (six) hours as needed for severe pain. 12/10/16   McDonald, Laymond Purser, PA-C    Family History History reviewed. No pertinent family history.  Social History Social History   Tobacco Use   Smoking status: Every Day   Smokeless tobacco: Never  Substance Use Topics   Alcohol use: Yes     Allergies   Patient has no known allergies.   Review of Systems Review of Systems  Constitutional:  Positive for chills. Negative for fever.  HENT:  Positive for congestion. Negative for ear pain and sore throat.   Eyes:  Negative for discharge and redness.  Respiratory:  Positive for cough. Negative for shortness of breath.   Gastrointestinal:  Negative for abdominal pain, nausea and vomiting.    Physical Exam Triage Vital Signs ED Triage Vitals  Enc Vitals Group     BP 03/05/21 1521 (!) 142/89     Pulse Rate 03/05/21 1521 96  Resp --      Temp 03/05/21 1521 98.2 F (36.8 C)     Temp Source 03/05/21 1521 Oral     SpO2 03/05/21 1521 96 %     Weight 03/05/21 1522 145 lb (65.8 kg)     Height 03/05/21 1522 5\' 8"  (1.727 m)     Head Circumference --      Peak Flow --      Pain Score 03/05/21 1522 0     Pain Loc --      Pain Edu? --      Excl. in [place]? --    No data found.  Updated Vital Signs BP (!) 142/89 (BP Location: Right Arm)   Pulse 96   Temp 98.2 F (36.8 C) (Oral)   Ht 5\' 8"  (1.727 m)   Wt 145 lb (65.8 kg)   SpO2 96%   BMI 22.05 kg/m   Physical Exam Vitals and nursing note  reviewed.  Constitutional:      General: He is not in acute distress.    Appearance: Normal appearance. He is not ill-appearing.  HENT:     Head: Normocephalic and atraumatic.     Nose: Congestion present.     Mouth/Throat:     Mouth: Mucous membranes are moist.     Pharynx: Oropharynx is clear. No oropharyngeal exudate or posterior oropharyngeal erythema.  Eyes:     Conjunctiva/sclera: Conjunctivae normal.  Cardiovascular:     Rate and Rhythm: Normal rate and regular rhythm.     Heart sounds: Normal heart sounds. No murmur heard. Pulmonary:     Effort: Pulmonary effort is normal. No respiratory distress.     Breath sounds: Wheezing (rare) present. No rhonchi or rales.  Skin:    General: Skin is warm and dry.  Neurological:     Mental Status: He is alert.  Psychiatric:        Mood and Affect: Mood normal.        Thought Content: Thought content normal.     UC Treatments / Results  Labs (all labs ordered are listed, but only abnormal results are displayed) Labs Reviewed  COVID-19, FLU A+B NAA    EKG   Radiology No results found.  Procedures Procedures (including critical care time)  Medications Ordered in UC Medications - No data to display  Initial Impression / Assessment and Plan / UC Course  I have reviewed the triage vital signs and the nursing notes.  Pertinent labs & imaging results that were available during my care of the patient were reviewed by me and considered in my medical decision making (see chart for details).  Suspect viral etiology of symptoms with possible early bronchitis.  Will treat with albuterol inhaler and screening ordered for COVID and flu.  Will await results for further recommendation.  Encouraged follow-up with any worsening symptoms or further concerns in the meantime.  Final Clinical Impressions(s) / UC Diagnoses   Final diagnoses:  Acute upper respiratory infection   Discharge Instructions   None    ED Prescriptions      Medication Sig Dispense Auth. Provider   albuterol (VENTOLIN HFA) 108 (90 Base) MCG/ACT inhaler Inhale 1-2 puffs into the lungs every 6 (six) hours as needed for wheezing or shortness of breath. 8 g Francene Finders, PA-C      PDMP not reviewed this encounter.   Francene Finders, PA-C 03/05/21 [phone]

## 2021-03-05 NOTE — ED Triage Notes (Signed)
Patient c/o cold sx's, cough, chest congestion since Monday.  Patient has been taken Thera-Flu and Nyquil.  Patient is vaccinated for COVID.

## 2021-03-06 LAB — COVID-19, FLU A+B NAA
Influenza A, NAA: NOT DETECTED
Influenza B, NAA: NOT DETECTED
SARS-CoV-2, NAA: NOT DETECTED

## 2023-06-13 ENCOUNTER — Encounter: Payer: Self-pay | Admitting: *Deleted

## 2023-06-13 ENCOUNTER — Other Ambulatory Visit: Payer: Self-pay

## 2023-06-13 ENCOUNTER — Ambulatory Visit
Admission: EM | Admit: 2023-06-13 | Discharge: 2023-06-13 | Disposition: A | Discharge status: Home / Self Care | Payer: 59 | Attending: Physician Assistant | Admitting: Physician Assistant

## 2023-06-13 DIAGNOSIS — M792 Neuralgia and neuritis, unspecified: Secondary | ICD-10-CM | POA: Diagnosis not present

## 2023-06-13 DIAGNOSIS — B029 Zoster without complications: Secondary | ICD-10-CM

## 2023-06-13 MED ORDER — LIDOCAINE 5 % EX PTCH: 1.0000 | MEDICATED_PATCH | CUTANEOUS | 0 refills | Status: AC

## 2023-06-13 MED ORDER — GABAPENTIN 300 MG PO CAPS: ORAL_CAPSULE | ORAL | 0 refills | Status: AC

## 2023-06-13 MED ORDER — VALACYCLOVIR HCL 1 G PO TABS: 1000.0000 mg | ORAL_TABLET | Freq: Three times a day (TID) | ORAL | 0 refills | Status: AC

## 2023-06-13 NOTE — ED Triage Notes (Signed)
Reports pain in upper back between shoulders, throbbing since last Thursday. Ibuprofen and topical creams not helping. Throbbing "comes and goes" and is not radiating into left shoulder. He ?pinched nerve. Denies chest pain/sob. States "this is different from a muscle pull"

## 2023-06-13 NOTE — Discharge Instructions (Signed)
You have shingles.  Start Valtrex 3 times daily for 7 days.  Take gabapentin at night.  This can make you sleepy.  If you tolerate this after taking for a few nights you can increase it to twice a day.  You can continue Tylenol and ibuprofen.  Apply lidocaine patch during the night and then remove this during the day.  Use only 1 patch per 24 hours.  Keep the area clean with soap and water to prevent secondary infection.  If your symptoms are not improving within a week or if anything worsens you need to be seen immediately.

## 2023-06-13 NOTE — ED Provider Notes (Addendum)
[facility]    CSN: [id number] Arrival date & time: 06/13/23  1055      History   Chief Complaint Chief Complaint  Patient presents with   Back Pain   Shoulder Pain    HPI Curtis Herrera is a 63 y.o. male.   Patient presents today with a 5-day history of of pain between his shoulder blades that radiates into his left arm.  He denies any known injury increase in activity prior to symptom onset.  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  He has been taking ibuprofen without improvement.  He has also tried applying Tiger balm and Biofreeze without improvement.  Denies any fever, nausea, vomiting.  Denies previous injury or surgery involving his back.  He has not had the shingles vaccine.  He has not noticed any rash but is unable to view this area.  He is right-handed.  Denies any numbness or paresthesias in his hands.    Past Medical History:  Diagnosis Date   Diabetes mellitus without complication [facility])    pt denies    Patient Active Problem List   Diagnosis Date Noted   NUMBNESS, ARM 12/17/2009   TOBACCO ABUSE 01/15/2008   EPICONDYLITIS, RIGHT 01/15/2008   LIPOMA 06/16/2007   HYPERTENSION 06/14/2007   DYSURIA 04/04/2007   HYPERLIPIDEMIA 03/13/2007   ERECTILE DYSFUNCTION 03/13/2007   BACK PAIN 03/13/2007   DIABETES MELLITUS, TYPE II 03/11/2007   GERD 03/11/2007   Other specified disorders of bladder 03/11/2007   Headache(784.0) 03/11/2007   DEPRESSION, HX OF 03/11/2007    History reviewed. No pertinent surgical history.     Home Medications    Prior to Admission medications   Medication Sig Start Date End Date Taking? Authorizing Provider  gabapentin (NEURONTIN) 300 MG capsule Take 1 tablet (300 mg) at night for 2 to 3 days.  If you tolerate this medication you can increase to 1 tablet (300mg ) twice daily thereafter.  06/13/23  Yes Shraga Custard K, PA-C  ibuprofen (ADVIL,MOTRIN) 200 MG tablet Take 400-600 mg by mouth every 6 (six) hours as needed for headache or moderate pain.   Yes [provider]  lidocaine (LIDODERM) 5 % Place 1 patch onto the skin daily. Remove & Discard patch within 12 hours or as directed by MD 06/13/23  Yes Yeila Morro, Noberto Retort, PA-C  Multiple Vitamin (MULTIVITAMIN WITH MINERALS) TABS tablet Take 1 tablet by mouth daily.   Yes [provider]  valACYclovir (VALTREX) 1000 MG tablet Take 1 tablet (1,000 mg total) by mouth 3 (three) times daily for 7 days. 06/13/23 06/20/23 Yes Sinthia Karabin K, PA-C  albuterol (VENTOLIN HFA) 108 (90 Base) MCG/ACT inhaler Inhale 1-2 puffs into the lungs every 6 (six) hours as needed for wheezing or shortness of breath. Patient not taking: Reported on 06/13/2023 03/05/21   Koren Bound    Family History History reviewed. No pertinent family history.  Social History Social History   Tobacco Use   Smoking status: Every Day    Types: Cigarettes   Smokeless tobacco: Never  Substance Use Topics   Alcohol use: Yes    Comment: socially   Drug use: Not Currently    Types: Marijuana     Allergies   Patient has no known allergies.   Review of Systems Review of Systems  Constitutional:  Positive for activity  change. Negative for appetite change, fatigue and fever.  Respiratory:  Negative for shortness of breath.   Cardiovascular:  Negative for chest pain.  Gastrointestinal:  Negative for abdominal pain, diarrhea, nausea and vomiting.  Musculoskeletal:  Positive for arthralgias and back pain. Negative for myalgias.  Neurological:  Negative for weakness and numbness.     Physical Exam Triage Vital Signs ED Triage Vitals  Encounter Vitals Group     BP 06/13/23 1400 (!) 137/95     Systolic BP Percentile --      Diastolic BP Percentile --      Pulse Rate 06/13/23 1400 (!) 104     Resp 06/13/23 1400 18     Temp 06/13/23 1400 98.8  F (37.1 C)     Temp Source 06/13/23 1400 Oral     SpO2 06/13/23 1400 98 %     Weight --      Height --      Head Circumference --      Peak Flow --      Pain Score 06/13/23 1357 7     Pain Loc --      Pain Education --      Exclude from Growth Chart --    No data found.  Updated Vital Signs BP (!) 137/95 (BP Location: Left Arm)   Pulse (!) 106   Temp 98.8 F (37.1 C) (Oral)   Resp 18   SpO2 98%   Visual Acuity Right Eye Distance:   Left Eye Distance:   Bilateral Distance:    Right Eye Near:   Left Eye Near:    Bilateral Near:     Physical Exam Vitals reviewed.  Constitutional:      General: He is awake.     Appearance: Normal appearance. He is well-developed. He is not ill-appearing.     Comments: Very pleasant male appears stated age in no acute distress sitting comfortably in exam room  HENT:     Head: Normocephalic and atraumatic.     Mouth/Throat:     Pharynx: No oropharyngeal exudate, posterior oropharyngeal erythema or uvula swelling.  Cardiovascular:     Rate and Rhythm: Normal rate and regular rhythm.     Heart sounds: Normal heart sounds, S1 normal and S2 normal. No murmur heard. Pulmonary:     Effort: Pulmonary effort is normal.     Breath sounds: Normal breath sounds. No stridor. No wheezing, rhonchi or rales.     Comments: Clear to auscultation bilaterally Abdominal:     Palpations: Abdomen is soft.     Tenderness: There is no abdominal tenderness.  Musculoskeletal:     Cervical back: No tenderness or bony tenderness.     Thoracic back: No tenderness or bony tenderness.     Lumbar back: No tenderness or bony tenderness.     Comments: Back: No pain percussion of vertebrae.  No tenderness palpation of paraspinal muscles.  No deformity or step-off noted.  Normal active range of motion with flexion, extension, rotation.  Normal active range of motion of shoulders; strength 5/5 bilateral upper extremities.  He is neurovascular intact.  Skin:     Findings: Erythema and rash present. Rash is vesicular.          Comments: Erythematous rash with fine vesicles noted left thoracic back in the T1/T2 distribution.  Neurological:     Mental Status: He is alert.  Psychiatric:        Behavior: Behavior is cooperative.  UC Treatments / Results  Labs (all labs ordered are listed, but only abnormal results are displayed) Labs Reviewed - No data to display  EKG   Radiology No results found.  Procedures Procedures (including critical care time)  Medications Ordered in UC Medications - No data to display  Initial Impression / Assessment and Plan / UC Course  I have reviewed the triage vital signs and the nursing notes.  Pertinent labs & imaging results that were available during my care of the patient were reviewed by me and considered in my medical decision making (see chart for details).     Patient is probably tachycardic but otherwise well-appearing, afebrile, nontoxic.  On visual inspection patient has rash consistent with shingles.  Will treat with Valtrex 1000 mg 3 times daily for 1 week.  Will use gabapentin 300 mg to help with neuropathic pain.  Will start 1 tablet at night for 2 to 3 days and if he tolerates this can increase it to twice daily but we did discuss that it can be sedating so he should not drive or drink alcohol with taking it.  He can use Tylenol and ibuprofen for pain relief.  He was given lidocaine patch to help with pain with instruction to apply this for 12 hours and remove it for 12 hours using only 1 patch per 24 hours.  We discussed that he is contagious until the rash heals at all the lesions have crusted over so should avoid contact with anyone with a weakened immune system including pregnant individuals and those who are on chemotherapy or have not been vaccinated/had chickenpox.  We discussed that if anything worsens or changes he needs to be seen immediately.  Strict return precautions given.  Work  excuse note provided.  Final Clinical Impressions(s) / UC Diagnoses   Final diagnoses:  Herpes zoster without complication  Neuropathic pain     Discharge Instructions      You have shingles.  Start Valtrex 3 times daily for 7 days.  Take gabapentin at night.  This can make you sleepy.  If you tolerate this after taking for a few nights you can increase it to twice a day.  You can continue Tylenol and ibuprofen.  Apply lidocaine patch during the night and then remove this during the day.  Use only 1 patch per 24 hours.  Keep the area clean with soap and water to prevent secondary infection.  If your symptoms are not improving within a week or if anything worsens you need to be seen immediately.     ED Prescriptions     Medication Sig Dispense Auth. Provider   valACYclovir (VALTREX) 1000 MG tablet Take 1 tablet (1,000 mg total) by mouth 3 (three) times daily for 7 days. 21 tablet Castor Gittleman K, PA-C   gabapentin (NEURONTIN) 300 MG capsule Take 1 tablet (300 mg) at night for 2 to 3 days.  If you tolerate this medication you can increase to 1 tablet (300mg ) twice daily thereafter. 28 capsule Cloa Bushong K, PA-C   lidocaine (LIDODERM) 5 % Place 1 patch onto the skin daily. Remove & Discard patch within 12 hours or as directed by MD 30 patch China Deitrick K, PA-C      PDMP not reviewed this encounter.   Jeani Hawking, PA-C 06/13/23 1510    Latrish Mogel, Noberto Retort, PA-C 06/13/23 1527

## 2023-07-06 ENCOUNTER — Encounter: Payer: Self-pay | Admitting: Family

## 2023-07-06 ENCOUNTER — Ambulatory Visit (INDEPENDENT_AMBULATORY_CARE_PROVIDER_SITE_OTHER): Payer: 59 | Admitting: Family

## 2023-07-06 VITALS — BP 131/82 | HR 103 | Temp 98.4°F | Resp 16 | Ht 68.0 in | Wt 165.8 lb

## 2023-07-06 DIAGNOSIS — Z7689 Persons encountering health services in other specified circumstances: Secondary | ICD-10-CM | POA: Diagnosis not present

## 2023-07-06 DIAGNOSIS — B029 Zoster without complications: Secondary | ICD-10-CM

## 2023-07-06 DIAGNOSIS — M792 Neuralgia and neuritis, unspecified: Secondary | ICD-10-CM

## 2023-07-06 NOTE — Progress Notes (Signed)
 Patient is her for urgent care visit from shingles. Patient said the pain has eased up 2/10. Patient said that he does not have DM2

## 2023-07-06 NOTE — Progress Notes (Signed)
 Subjective:    XXAVIER NOON - 63 y.o. male MRN [id number]  Date of birth: [birth year]  HPI  Curtis Herrera is to establish care and Urgent Care follow-up.  Current issues and/or concerns: 06/13/2023 [facility] at [facility]) per PA note:  Initial Impression / Assessment and Plan / UC Course  I have reviewed the triage vital signs and the nursing notes.   Pertinent labs & imaging results that were available during my care of the patient were reviewed by me and considered in my medical decision making (see chart for details).     Patient is probably tachycardic but otherwise well-appearing, afebrile, nontoxic.  On visual inspection patient has rash consistent with shingles.  Will treat with Valtrex 1000 mg 3 times daily for 1 week.  Will use gabapentin 300 mg to help with neuropathic pain.  Will start 1 tablet at night for 2 to 3 days and if he tolerates this can increase it to twice daily but we did discuss that it can be sedating so he should not drive or drink alcohol with taking it.  He can use Tylenol and ibuprofen for pain relief.  He was given lidocaine patch to help with pain with instruction to apply this for 12 hours and remove it for 12 hours using only 1 patch per 24 hours.  We discussed that he is contagious until the rash heals at all the lesions have crusted over so should avoid contact with anyone with a weakened immune system including pregnant individuals and those who are on chemotherapy or have not been vaccinated/had chickenpox.  We discussed that if anything worsens or changes he needs to be seen immediately.  Strict return precautions given.  Work excuse note provided.  Today's office visit 07/06/2023: - Patient reports feeling improved since Urgent Care visit. Reports he completed Valtrex. Reports taking Gabapentin and over-the-counter medications as needed.  - Reports he plans to return at later date for annual physical exam. - No further  issues/concerns for discussion today.   ROS per HPI    Health Maintenance:  Health Maintenance Due  Topic Date Due   FOOT EXAM  Never done   OPHTHALMOLOGY EXAM  Never done   Hepatitis C Screening  Never done   Colonoscopy  Never done   Diabetic kidney evaluation - eGFR measurement  01/09/2009   Pneumococcal Vaccine 22-32 Years old (2 of 2 - PCV) 03/27/2009   Diabetic kidney evaluation - Urine ACR  01/14/2010   Zoster Vaccines- Shingrix (1 of 2) Never done   HEMOGLOBIN A1C  06/19/2010   DTaP/Tdap/Td (2 - Tdap) 05/13/2016   INFLUENZA VACCINE  Never done   COVID-19 Vaccine (1 - 2024-25 season) Never done     Past Medical History: Patient Active Problem List   Diagnosis Date Noted   NUMBNESS, ARM 12/17/2009   TOBACCO ABUSE 01/15/2008   EPICONDYLITIS, RIGHT 01/15/2008   LIPOMA 06/16/2007   HYPERTENSION 06/14/2007   DYSURIA 04/04/2007   HYPERLIPIDEMIA 03/13/2007   ERECTILE DYSFUNCTION 03/13/2007   BACK PAIN 03/13/2007   DIABETES MELLITUS, TYPE II 03/11/2007   GERD 03/11/2007   Other specified disorders of bladder 03/11/2007   Headache(784.0) 03/11/2007   DEPRESSION, HX OF 03/11/2007      Social History   reports that he has been smoking cigarettes. He has never used smokeless tobacco. He reports current alcohol use. He reports that he does not currently use drugs after having used the following drugs: Marijuana.  Family History  family history is not on file.   Medications: reviewed and updated   Objective:   Physical Exam BP 131/82   Pulse (!) 103   Temp 98.4 F (36.9 C) (Oral)   Resp 16   Ht 5\' 8"  (1.727 m)   Wt 165 lb 12.8 oz (75.2 kg)   SpO2 94%   BMI 25.21 kg/m   Physical Exam HENT:     Head: Normocephalic and atraumatic.     Nose: Nose normal.     Mouth/Throat:     Mouth: Mucous membranes are moist.     Pharynx: Oropharynx is clear.  Eyes:     Extraocular Movements: Extraocular movements intact.     Conjunctiva/sclera: Conjunctivae normal.      Pupils: Pupils are equal, round, and reactive to light.  Cardiovascular:     Rate and Rhythm: Tachycardia present.     Pulses: Normal pulses.     Heart sounds: Normal heart sounds.  Pulmonary:     Effort: Pulmonary effort is normal.     Breath sounds: Normal breath sounds.  Musculoskeletal:        General: Normal range of motion.     Cervical back: Normal range of motion and neck supple.  Skin:    General: Skin is warm and dry.  Neurological:     General: No focal deficit present.     Mental Status: He is alert and oriented to person, place, and time.  Psychiatric:        Mood and Affect: Mood normal.        Behavior: Behavior normal.       Assessment & Plan:  1. Encounter to establish care (Primary) - Patient presents today to establish care. During the interim follow-up with primary provider as scheduled.  - Return for annual physical examination, labs, and health maintenance. Arrive fasting meaning having no food for at least 8 hours prior to appointment. You may have only water or black coffee. Please take scheduled medications as normal.  2. Herpes zoster without complication 3. Neuropathic pain - Resolved.  - Continue Gabapentin as prescribed. Counseled on medication adherence/adverse effects. - Follow-up with primary provider as scheduled.   Patient was given clear instructions to go to Emergency Department or return to medical center if symptoms don't improve, worsen, or new problems develop.The patient verbalized understanding.  I discussed the assessment and treatment plan with the patient. The patient was provided an opportunity to ask questions and all were answered. The patient agreed with the plan and demonstrated an understanding of the instructions.   The patient was advised to call back or seek an in-person evaluation if the symptoms worsen or if the condition fails to improve as anticipated.    Ricky Stabs, NP 07/06/2023, 8:37 AM Primary Care at  [facility]

## 2023-08-08 ENCOUNTER — Encounter: Payer: 59 | Admitting: Family

## 2023-08-19 ENCOUNTER — Encounter: Payer: Self-pay | Admitting: Family

## 2023-08-19 ENCOUNTER — Ambulatory Visit (INDEPENDENT_AMBULATORY_CARE_PROVIDER_SITE_OTHER): Admitting: Family

## 2023-08-19 VITALS — BP 130/90 | HR 97 | Temp 98.0°F | Ht 68.0 in | Wt 159.2 lb

## 2023-08-19 DIAGNOSIS — Z131 Encounter for screening for diabetes mellitus: Secondary | ICD-10-CM | POA: Diagnosis not present

## 2023-08-19 DIAGNOSIS — Z130 Encounter for screening for diseases of the blood and blood-forming organs and certain disorders involving the immune mechanism: Secondary | ICD-10-CM | POA: Diagnosis not present

## 2023-08-19 DIAGNOSIS — Z13228 Encounter for screening for other metabolic disorders: Secondary | ICD-10-CM | POA: Diagnosis not present

## 2023-08-19 DIAGNOSIS — Z13220 Encounter for screening for lipoid disorders: Secondary | ICD-10-CM

## 2023-08-19 DIAGNOSIS — R030 Elevated blood-pressure reading, without diagnosis of hypertension: Secondary | ICD-10-CM

## 2023-08-19 DIAGNOSIS — Z0000 Encounter for general adult medical examination without abnormal findings: Secondary | ICD-10-CM

## 2023-08-19 DIAGNOSIS — R194 Change in bowel habit: Secondary | ICD-10-CM

## 2023-08-19 DIAGNOSIS — J3089 Other allergic rhinitis: Secondary | ICD-10-CM

## 2023-08-19 DIAGNOSIS — Z1329 Encounter for screening for other suspected endocrine disorder: Secondary | ICD-10-CM

## 2023-08-19 DIAGNOSIS — Z1211 Encounter for screening for malignant neoplasm of colon: Secondary | ICD-10-CM

## 2023-08-19 DIAGNOSIS — Z1159 Encounter for screening for other viral diseases: Secondary | ICD-10-CM

## 2023-08-19 NOTE — Progress Notes (Signed)
 Patient ID: Curtis Herrera, male    DOB: 1961-04-09  MRN: [id number]  CC: Annual Exam  Subjective: Duel Conrad is a 63 y.o. male who presents for annual exam.   His concerns today include:  - Allergies. Taking over-the-counter medications to help. Declines referral to Allergy.  - States in the past he was diagnosed borderline diabetes and prescribed medication. States later he was seen by another provider who told him that he never had diabetes.  - Frequent bowel movements. Denies red flag symptoms.  - States feeling improved since shingles at previous office visit.  Patient Active Problem List   Diagnosis Date Noted   NUMBNESS, ARM 12/17/2009   TOBACCO ABUSE 01/15/2008   EPICONDYLITIS, RIGHT 01/15/2008   LIPOMA 06/16/2007   HYPERTENSION 06/14/2007   DYSURIA 04/04/2007   HYPERLIPIDEMIA 03/13/2007   ERECTILE DYSFUNCTION 03/13/2007   BACK PAIN 03/13/2007   DIABETES MELLITUS, TYPE II 03/11/2007   GERD 03/11/2007   Other specified disorders of bladder 03/11/2007   Headache(784.0) 03/11/2007   DEPRESSION, HX OF 03/11/2007     Current Outpatient Medications on File Prior to Visit  Medication Sig Dispense Refill   albuterol (VENTOLIN HFA) 108 (90 Base) MCG/ACT inhaler Inhale 1-2 puffs into the lungs every 6 (six) hours as needed for wheezing or shortness of breath. (Patient not taking: Reported on 08/19/2023) 8 g 0   gabapentin (NEURONTIN) 300 MG capsule Take 1 tablet (300 mg) at night for 2 to 3 days.  If you tolerate this medication you can increase to 1 tablet (300mg ) twice daily thereafter. (Patient not taking: Reported on 08/19/2023) 28 capsule 0   ibuprofen (ADVIL,MOTRIN) 200 MG tablet Take 400-600 mg by mouth every 6 (six) hours as needed for headache or moderate pain. (Patient not taking: Reported on 08/19/2023)     lidocaine (LIDODERM) 5 % Place 1 patch onto the skin daily. Remove & Discard patch within 12 hours or as directed by MD (Patient not taking: Reported on 08/19/2023)  30 patch 0   Multiple Vitamin (MULTIVITAMIN WITH MINERALS) TABS tablet Take 1 tablet by mouth daily. (Patient not taking: Reported on 08/19/2023)     No current facility-administered medications on file prior to visit.    No Known Allergies  Social History   Socioeconomic History   Marital status: Single    Spouse name: Not on file   Number of children: Not on file   Years of education: Not on file   Highest education level: Not on file  Occupational History   Not on file  Tobacco Use   Smoking status: Every Day    Types: Cigarettes   Smokeless tobacco: Never  Substance and Sexual Activity   Alcohol use: Yes    Comment: socially   Drug use: Not Currently    Types: Marijuana   Sexual activity: Not on file  Other Topics Concern   Not on file  Social History Narrative   Not on file   Social Drivers of Health   Financial Resource Strain: Not on file  Food Insecurity: Not on file  Transportation Needs: Not on file  Physical Activity: Not on file  Stress: Not on file  Social Connections: Not on file  Intimate Partner Violence: Not on file    No family history on file.  No past surgical history on file.  ROS: Review of Systems Negative except as stated above  PHYSICAL EXAM: BP (!) 130/90   Pulse 97   Temp 98 F (36.7 C) (  Oral)   Ht 5\' 8"  (1.727 m)   Wt 159 lb 3.2 oz (72.2 kg)   SpO2 94%   BMI 24.21 kg/m   Physical Exam HENT:     Head: Normocephalic and atraumatic.     Right Ear: Tympanic membrane, ear canal and external ear normal.     Left Ear: Tympanic membrane, ear canal and external ear normal.     Nose: Nose normal.     Mouth/Throat:     Mouth: Mucous membranes are moist.     Pharynx: Oropharynx is clear.  Eyes:     Extraocular Movements: Extraocular movements intact.     Conjunctiva/sclera: Conjunctivae normal.     Pupils: Pupils are equal, round, and reactive to light.  Neck:     Thyroid: No thyroid mass, thyromegaly or thyroid tenderness.   Cardiovascular:     Rate and Rhythm: Normal rate and regular rhythm.     Pulses: Normal pulses.     Heart sounds: Normal heart sounds.  Pulmonary:     Effort: Pulmonary effort is normal.     Breath sounds: Normal breath sounds.  Abdominal:     General: Bowel sounds are normal.     Palpations: Abdomen is soft.  Genitourinary:    Comments: Patient declined. Musculoskeletal:        General: Normal range of motion.     Right shoulder: Normal.     Left shoulder: Normal.     Right upper arm: Normal.     Left upper arm: Normal.     Right elbow: Normal.     Left elbow: Normal.     Right forearm: Normal.     Left forearm: Normal.     Right wrist: Normal.     Left wrist: Normal.     Right hand: Normal.     Left hand: Normal.     Cervical back: Normal, normal range of motion and neck supple.     Thoracic back: Normal.     Lumbar back: Normal.     Right hip: Normal.     Left hip: Normal.     Right upper leg: Normal.     Left upper leg: Normal.     Right knee: Normal.     Left knee: Normal.     Right lower leg: Normal.     Left lower leg: Normal.     Right ankle: Normal.     Left ankle: Normal.     Right foot: Normal.     Left foot: Normal.  Skin:    General: Skin is warm and dry.     Capillary Refill: Capillary refill takes less than 2 seconds.  Neurological:     General: No focal deficit present.     Mental Status: He is alert and oriented to person, place, and time.  Psychiatric:        Mood and Affect: Mood normal.        Behavior: Behavior normal.     ASSESSMENT AND PLAN: 1. Annual physical exam (Primary) - Counseled on 150 minutes of exercise per week as tolerated, healthy eating (including decreased daily intake of saturated fats, cholesterol, added sugars, sodium), STI prevention, and routine healthcare maintenance.  2. Screening for metabolic disorder - Routine screening.  - CMP14+EGFR  3. Screening for deficiency anemia - Routine screening.  - CBC  4.  Diabetes mellitus screening - Routine screening.  - Hemoglobin A1c - Microalbumin / creatinine urine ratio  5. Screening cholesterol level -  Routine screening.  - Lipid panel  6. Thyroid disorder screen - Routine screening.  - TSH  7. Need for hepatitis C screening test - Routine screening.  - Hepatitis C Antibody  8. Colon cancer screening 9. Frequent bowel movements - Referral to Gastroenterology for colon cancer screening by colonoscopy. - Ambulatory referral to Gastroenterology  10. Perennial allergic rhinitis - Continue over-the-counter regimen.  - Patient declined referral to Allergy. - Follow-up with primary provider as scheduled.  11. Elevated blood pressure reading - Blood pressure not at goal during today's visit. Patient asymptomatic without chest pressure, chest pain, palpitations, shortness of breath, worst headache of life, and any additional red flag symptoms. - Follow-up with primary provider in 4 weeks or sooner if needed.    Patient was given the opportunity to ask questions.  Patient verbalized understanding of the plan and was able to repeat key elements of the plan. Patient was given clear instructions to go to Emergency Department or return to medical center if symptoms don't improve, worsen, or new problems develop.The patient verbalized understanding.   Orders Placed This Encounter  Procedures   Hepatitis C Antibody   CBC   Lipid panel   CMP14+EGFR   Hemoglobin A1c   TSH   Microalbumin / creatinine urine ratio   Ambulatory referral to Gastroenterology    Return in about 1 year (around 08/18/2024) for Physical per patient preference.  Rema Fendt, NP

## 2023-08-19 NOTE — Progress Notes (Signed)
 Patient states bad pollen allergy.

## 2023-08-20 LAB — CBC
Hematocrit: 43.7 % (ref 37.5–51.0)
Hemoglobin: 14.6 g/dL (ref 13.0–17.7)
MCH: 31.2 pg (ref 26.6–33.0)
MCHC: 33.4 g/dL (ref 31.5–35.7)
MCV: 93 fL (ref 79–97)
Platelets: 222 10*3/uL (ref 150–450)
RBC: 4.68 x10E6/uL (ref 4.14–5.80)
RDW: 15.5 % — ABNORMAL HIGH (ref 11.6–15.4)
WBC: 8.7 10*3/uL (ref 3.4–10.8)

## 2023-08-20 LAB — HEMOGLOBIN A1C
Est. average glucose Bld gHb Est-mCnc: 131 mg/dL
Hgb A1c MFr Bld: 6.2 % — ABNORMAL HIGH (ref 4.8–5.6)

## 2023-08-20 LAB — CMP14+EGFR
ALT: 12 IU/L (ref 0–44)
AST: 19 IU/L (ref 0–40)
Albumin: 4.4 g/dL (ref 3.9–4.9)
Alkaline Phosphatase: 66 IU/L (ref 44–121)
BUN/Creatinine Ratio: 6 — ABNORMAL LOW (ref 10–24)
BUN: 6 mg/dL — ABNORMAL LOW (ref 8–27)
Bilirubin Total: 0.3 mg/dL (ref 0.0–1.2)
CO2: 22 mmol/L (ref 20–29)
Calcium: 9.3 mg/dL (ref 8.6–10.2)
Chloride: 104 mmol/L (ref 96–106)
Creatinine, Ser: 0.94 mg/dL (ref 0.76–1.27)
Globulin, Total: 2.4 g/dL (ref 1.5–4.5)
Glucose: 99 mg/dL (ref 70–99)
Potassium: 4.2 mmol/L (ref 3.5–5.2)
Sodium: 139 mmol/L (ref 134–144)
Total Protein: 6.8 g/dL (ref 6.0–8.5)
eGFR: 91 mL/min/{1.73_m2} (ref >59)

## 2023-08-20 LAB — LIPID PANEL
Chol/HDL Ratio: 3.9 ratio (ref 0.0–5.0)
Cholesterol, Total: 209 mg/dL — ABNORMAL HIGH (ref 100–199)
HDL: 54 mg/dL (ref >39)
LDL Chol Calc (NIH): 134 mg/dL — ABNORMAL HIGH (ref 0–99)
Triglycerides: 119 mg/dL (ref 0–149)
VLDL Cholesterol Cal: 21 mg/dL (ref 5–40)

## 2023-08-20 LAB — HEPATITIS C ANTIBODY: Hep C Virus Ab: NONREACTIVE

## 2023-08-20 LAB — TSH: TSH: 1.2 u[IU]/mL (ref 0.450–4.500)

## 2023-08-21 LAB — MICROALBUMIN / CREATININE URINE RATIO
Creatinine, Urine: 168 mg/dL
Microalb/Creat Ratio: 2 mg/g{creat} (ref 0–29)
Microalbumin, Urine: 3.2 ug/mL

## 2023-08-22 ENCOUNTER — Encounter: Payer: Self-pay | Admitting: Family

## 2023-08-22 ENCOUNTER — Other Ambulatory Visit: Payer: Self-pay | Admitting: Family

## 2023-08-22 DIAGNOSIS — E785 Hyperlipidemia, unspecified: Secondary | ICD-10-CM

## 2023-08-22 MED ORDER — ATORVASTATIN CALCIUM 20 MG PO TABS: 20.0000 mg | ORAL_TABLET | Freq: Every day | ORAL | 0 refills | Status: AC

## 2023-08-22 NOTE — Progress Notes (Signed)
 I called patient and made him aware of his lab results and NP Zonia Kief recommendations Medication changes / Follow up labs / Other changes or recommendations:   - Follow-up prediabetes in 6 months.  - Trial Atorvastatin for high cholesterol. Schedule lab only appointment to recheck fasting cholesterol in 4 to 6 weeks.

## 2024-08-20 ENCOUNTER — Encounter: Admitting: Family
